# Patient Record
Sex: Female | Born: 1960 | Hispanic: Refuse to answer | Marital: Married | Smoking: Never smoker
Health system: Southern US, Community
[De-identification: ages and names within clinical notes are randomized; demographics above are authoritative.]

## PROBLEM LIST (undated history)

## (undated) DIAGNOSIS — Z9889 Other specified postprocedural states: Secondary | ICD-10-CM

## (undated) DIAGNOSIS — I2699 Other pulmonary embolism without acute cor pulmonale: Secondary | ICD-10-CM

## (undated) DIAGNOSIS — I82409 Acute embolism and thrombosis of unspecified deep veins of unspecified lower extremity: Secondary | ICD-10-CM

## (undated) DIAGNOSIS — R87629 Unspecified abnormal cytological findings in specimens from vagina: Secondary | ICD-10-CM

## (undated) DIAGNOSIS — E559 Vitamin D deficiency, unspecified: Secondary | ICD-10-CM

## (undated) DIAGNOSIS — I10 Essential (primary) hypertension: Secondary | ICD-10-CM

## (undated) DIAGNOSIS — M1990 Unspecified osteoarthritis, unspecified site: Secondary | ICD-10-CM

## (undated) DIAGNOSIS — Z87898 Personal history of other specified conditions: Secondary | ICD-10-CM

## (undated) HISTORY — DX: Essential (primary) hypertension: I10

## (undated) HISTORY — DX: Other pulmonary embolism without acute cor pulmonale: I26.99

## (undated) HISTORY — PX: SHOULDER SURGERY: SHX246

## (undated) HISTORY — DX: Other specified postprocedural states: Z98.890

## (undated) HISTORY — DX: Personal history of other specified conditions: Z87.898

## (undated) HISTORY — DX: Vitamin D deficiency, unspecified: E55.9

## (undated) HISTORY — PX: KNEE SURGERY: SHX244

## (undated) HISTORY — DX: Unspecified abnormal cytological findings in specimens from vagina: R87.629

## (undated) HISTORY — DX: Acute embolism and thrombosis of unspecified deep veins of unspecified lower extremity: I82.409

## (undated) HISTORY — DX: Unspecified osteoarthritis, unspecified site: M19.90

---

## 2001-06-15 ENCOUNTER — Ambulatory Visit (HOSPITAL_COMMUNITY): Admission: RE | Admit: 2001-06-15 | Discharge: 2001-06-15 | Payer: Self-pay | Admitting: Radiology

## 2003-06-25 ENCOUNTER — Emergency Department (HOSPITAL_COMMUNITY): Admission: AD | Admit: 2003-06-25 | Discharge: 2003-06-25 | Payer: Self-pay | Admitting: Family Medicine

## 2008-09-28 ENCOUNTER — Emergency Department (HOSPITAL_COMMUNITY): Admission: EM | Admit: 2008-09-28 | Discharge: 2008-09-28 | Payer: Self-pay | Admitting: Family Medicine

## 2009-02-21 ENCOUNTER — Ambulatory Visit: Payer: Self-pay | Admitting: Family Medicine

## 2009-05-22 ENCOUNTER — Ambulatory Visit: Payer: Self-pay | Admitting: Family Medicine

## 2009-07-03 ENCOUNTER — Ambulatory Visit (HOSPITAL_BASED_OUTPATIENT_CLINIC_OR_DEPARTMENT_OTHER): Admission: RE | Admit: 2009-07-03 | Discharge: 2009-07-03 | Payer: Self-pay | Admitting: Orthopedic Surgery

## 2009-08-22 ENCOUNTER — Ambulatory Visit: Payer: Self-pay | Admitting: Family Medicine

## 2009-11-06 ENCOUNTER — Ambulatory Visit: Payer: Self-pay | Admitting: Family Medicine

## 2009-11-06 ENCOUNTER — Other Ambulatory Visit: Admission: RE | Admit: 2009-11-06 | Discharge: 2009-11-06 | Payer: Self-pay | Admitting: Family Medicine

## 2009-11-19 ENCOUNTER — Ambulatory Visit: Payer: Self-pay | Admitting: Family Medicine

## 2010-06-03 LAB — POCT HEMOGLOBIN-HEMACUE: Hemoglobin: 12 g/dL (ref 12.0–15.0)

## 2013-06-23 ENCOUNTER — Encounter (HOSPITAL_COMMUNITY): Payer: Self-pay | Admitting: Emergency Medicine

## 2013-06-23 ENCOUNTER — Emergency Department (HOSPITAL_COMMUNITY): Payer: PRIVATE HEALTH INSURANCE

## 2013-06-23 ENCOUNTER — Inpatient Hospital Stay (HOSPITAL_COMMUNITY)
Admission: EM | Admit: 2013-06-23 | Discharge: 2013-06-24 | DRG: 176 | Disposition: A | Discharge status: Home / Self Care | Payer: PRIVATE HEALTH INSURANCE | Attending: Internal Medicine | Admitting: Internal Medicine

## 2013-06-23 ENCOUNTER — Emergency Department (HOSPITAL_COMMUNITY)
Admission: EM | Admit: 2013-06-23 | Discharge: 2013-06-23 | Disposition: A | Discharge status: Nursing Home (Includes ICF) | Payer: PRIVATE HEALTH INSURANCE | Source: Home / Self Care | Attending: Family Medicine | Admitting: Family Medicine

## 2013-06-23 DIAGNOSIS — I2699 Other pulmonary embolism without acute cor pulmonale: Secondary | ICD-10-CM

## 2013-06-23 DIAGNOSIS — E669 Obesity, unspecified: Secondary | ICD-10-CM

## 2013-06-23 DIAGNOSIS — R7309 Other abnormal glucose: Secondary | ICD-10-CM | POA: Diagnosis present

## 2013-06-23 DIAGNOSIS — R079 Chest pain, unspecified: Secondary | ICD-10-CM

## 2013-06-23 DIAGNOSIS — R0781 Pleurodynia: Secondary | ICD-10-CM

## 2013-06-23 DIAGNOSIS — D259 Leiomyoma of uterus, unspecified: Secondary | ICD-10-CM | POA: Diagnosis present

## 2013-06-23 DIAGNOSIS — D509 Iron deficiency anemia, unspecified: Secondary | ICD-10-CM | POA: Diagnosis present

## 2013-06-23 DIAGNOSIS — D649 Anemia, unspecified: Secondary | ICD-10-CM

## 2013-06-23 DIAGNOSIS — K219 Gastro-esophageal reflux disease without esophagitis: Secondary | ICD-10-CM

## 2013-06-23 DIAGNOSIS — Z6841 Body Mass Index (BMI) 40.0 and over, adult: Secondary | ICD-10-CM

## 2013-06-23 DIAGNOSIS — R739 Hyperglycemia, unspecified: Secondary | ICD-10-CM

## 2013-06-23 DIAGNOSIS — I82409 Acute embolism and thrombosis of unspecified deep veins of unspecified lower extremity: Secondary | ICD-10-CM | POA: Diagnosis present

## 2013-06-23 DIAGNOSIS — I5030 Unspecified diastolic (congestive) heart failure: Secondary | ICD-10-CM | POA: Diagnosis present

## 2013-06-23 DIAGNOSIS — Z86711 Personal history of pulmonary embolism: Secondary | ICD-10-CM | POA: Diagnosis present

## 2013-06-23 LAB — CBC WITH DIFFERENTIAL/PLATELET
BASOS ABS: 0 10*3/uL (ref 0.0–0.1)
Basophils Relative: 0 % (ref 0–1)
Eosinophils Absolute: 0.1 10*3/uL (ref 0.0–0.7)
Eosinophils Relative: 1 % (ref 0–5)
HCT: 27.2 % — ABNORMAL LOW (ref 36.0–46.0)
HEMOGLOBIN: 8.5 g/dL — AB (ref 12.0–15.0)
LYMPHS PCT: 21 % (ref 12–46)
Lymphs Abs: 2 10*3/uL (ref 0.7–4.0)
MCH: 23.3 pg — ABNORMAL LOW (ref 26.0–34.0)
MCHC: 31.3 g/dL (ref 30.0–36.0)
MCV: 74.5 fL — ABNORMAL LOW (ref 78.0–100.0)
MONO ABS: 1.2 10*3/uL — AB (ref 0.1–1.0)
MONOS PCT: 13 % — AB (ref 3–12)
NEUTROS ABS: 6.1 10*3/uL (ref 1.7–7.7)
Neutrophils Relative %: 65 % (ref 43–77)
Platelets: 294 10*3/uL (ref 150–400)
RBC: 3.65 MIL/uL — ABNORMAL LOW (ref 3.87–5.11)
RDW: 18.1 % — ABNORMAL HIGH (ref 11.5–15.5)
WBC: 9.3 10*3/uL (ref 4.0–10.5)

## 2013-06-23 LAB — IRON AND TIBC: UIBC: 405 ug/dL — ABNORMAL HIGH (ref 125–400)

## 2013-06-23 LAB — FOLATE: FOLATE: 13.8 ng/mL

## 2013-06-23 LAB — BASIC METABOLIC PANEL
BUN: 7 mg/dL (ref 6–23)
CO2: 22 meq/L (ref 19–32)
CREATININE: 0.73 mg/dL (ref 0.50–1.10)
Calcium: 9.2 mg/dL (ref 8.4–10.5)
Chloride: 103 mEq/L (ref 96–112)
GFR calc Af Amer: >90 mL/min (ref >90)
GFR calc non Af Amer: >90 mL/min (ref >90)
Glucose, Bld: 119 mg/dL — ABNORMAL HIGH (ref 70–99)
Potassium: 4.2 mEq/L (ref 3.7–5.3)
Sodium: 138 mEq/L (ref 137–147)

## 2013-06-23 LAB — I-STAT TROPONIN, ED: TROPONIN I, POC: 0 ng/mL (ref 0.00–0.08)

## 2013-06-23 LAB — TSH: TSH: 4.24 u[IU]/mL (ref 0.350–4.500)

## 2013-06-23 LAB — ANTITHROMBIN III: ANTITHROMB III FUNC: 84 % (ref 75–120)

## 2013-06-23 LAB — HEMOGLOBIN A1C
Hgb A1c MFr Bld: 5.9 % — ABNORMAL HIGH (ref <5.7)
Mean Plasma Glucose: 123 mg/dL — ABNORMAL HIGH (ref <117)

## 2013-06-23 LAB — RETICULOCYTES
RBC.: 3.58 MIL/uL — ABNORMAL LOW (ref 3.87–5.11)
Retic Count, Absolute: 75.2 10*3/uL (ref 19.0–186.0)
Retic Ct Pct: 2.1 % (ref 0.4–3.1)

## 2013-06-23 LAB — VITAMIN B12: VITAMIN B 12: 472 pg/mL (ref 211–911)

## 2013-06-23 LAB — FERRITIN: Ferritin: 15 ng/mL (ref 10–291)

## 2013-06-23 MED ORDER — FERROUS FUMARATE 325 (106 FE) MG PO TABS
1.0000 | ORAL_TABLET | Freq: Two times a day (BID) | ORAL | Status: DC
  Administered 2013-06-23 – 2013-06-24 (×2): 106 mg via ORAL
  Filled 2013-06-23 (×3): qty 1

## 2013-06-23 MED ORDER — NITROGLYCERIN 0.4 MG SL SUBL
SUBLINGUAL_TABLET | SUBLINGUAL | Status: AC
  Filled 2013-06-23: qty 1

## 2013-06-23 MED ORDER — NITROGLYCERIN 0.4 MG SL SUBL
0.4000 mg | SUBLINGUAL_TABLET | SUBLINGUAL | Status: DC | PRN
  Administered 2013-06-23: 0.4 mg via SUBLINGUAL

## 2013-06-23 MED ORDER — ASPIRIN 325 MG PO TABS
325.0000 mg | ORAL_TABLET | Freq: Once | ORAL | Status: AC
  Administered 2013-06-23: 325 mg via ORAL
  Filled 2013-06-23: qty 1

## 2013-06-23 MED ORDER — ONDANSETRON HCL 4 MG/2ML IJ SOLN: 4.0000 mg | Freq: Four times a day (QID) | INTRAMUSCULAR | Status: DC | PRN

## 2013-06-23 MED ORDER — BENZONATATE 100 MG PO CAPS
200.0000 mg | ORAL_CAPSULE | Freq: Three times a day (TID) | ORAL | Status: DC | PRN
  Administered 2013-06-23: 200 mg via ORAL
  Filled 2013-06-23: qty 2

## 2013-06-23 MED ORDER — MORPHINE SULFATE 2 MG/ML IJ SOLN: 1.0000 mg | INTRAMUSCULAR | Status: DC | PRN

## 2013-06-23 MED ORDER — ONDANSETRON HCL 4 MG PO TABS: 4.0000 mg | ORAL_TABLET | Freq: Four times a day (QID) | ORAL | Status: DC | PRN

## 2013-06-23 MED ORDER — SODIUM CHLORIDE 0.9 % IV SOLN
INTRAVENOUS | Status: AC
  Administered 2013-06-23 – 2013-06-24 (×2): via INTRAVENOUS

## 2013-06-23 MED ORDER — SODIUM CHLORIDE 0.9 % IV SOLN
Freq: Once | INTRAVENOUS | Status: AC
  Administered 2013-06-23: 09:00:00 via INTRAVENOUS

## 2013-06-23 MED ORDER — SODIUM CHLORIDE 0.9 % IJ SOLN
3.0000 mL | Freq: Two times a day (BID) | INTRAMUSCULAR | Status: DC
  Administered 2013-06-23 – 2013-06-24 (×3): 3 mL via INTRAVENOUS

## 2013-06-23 MED ORDER — IOHEXOL 350 MG/ML SOLN
80.0000 mL | Freq: Once | INTRAVENOUS | Status: AC | PRN
  Administered 2013-06-23: 80 mL via INTRAVENOUS

## 2013-06-23 MED ORDER — POLYETHYLENE GLYCOL 3350 17 G PO PACK
17.0000 g | PACK | Freq: Every day | ORAL | Status: DC
  Administered 2013-06-24: 17 g via ORAL
  Filled 2013-06-23 (×2): qty 1

## 2013-06-23 MED ORDER — OXYCODONE HCL 5 MG PO TABS: 5.0000 mg | ORAL_TABLET | ORAL | Status: DC | PRN

## 2013-06-23 MED ORDER — ENOXAPARIN SODIUM 120 MG/0.8ML ~~LOC~~ SOLN
110.0000 mg | Freq: Two times a day (BID) | SUBCUTANEOUS | Status: DC
  Administered 2013-06-23 – 2013-06-24 (×3): 110 mg via SUBCUTANEOUS
  Filled 2013-06-23 (×4): qty 0.8

## 2013-06-23 MED ORDER — PANTOPRAZOLE SODIUM 40 MG PO TBEC
40.0000 mg | DELAYED_RELEASE_TABLET | Freq: Every day | ORAL | Status: DC
  Administered 2013-06-24: 40 mg via ORAL
  Filled 2013-06-23: qty 1

## 2013-06-23 MED ORDER — ACETAMINOPHEN 325 MG PO TABS: 650.0000 mg | ORAL_TABLET | Freq: Four times a day (QID) | ORAL | Status: DC | PRN

## 2013-06-23 MED ORDER — ACETAMINOPHEN 650 MG RE SUPP: 650.0000 mg | Freq: Four times a day (QID) | RECTAL | Status: DC | PRN

## 2013-06-23 NOTE — Progress Notes (Signed)
Patient has arrived to unit. Patient alert and oriented, no complaints at this time, denies pain. Patient placed on telemetry, [place] notified. Patients vital signs stable with BP 133/61, HR 86 in NSR, and 99% on RA. Will monitor patient.

## 2013-06-23 NOTE — ED Notes (Signed)
Patient transported to CT 

## 2013-06-23 NOTE — ED Provider Notes (Signed)
CSN: [id number]     Arrival date & time 06/23/13  0920 History   First MD Initiated Contact with Patient 06/23/13 [phone]     Chief Complaint  Patient presents with  . Chest Pain     (Consider location/radiation/quality/duration/timing/severity/associated sxs/prior Treatment) Patient is a 53 y.o. female presenting with chest pain. The history is provided by the patient. No language interpreter was used.  Chest Pain Pain location:  L chest Pain quality: sharp   Pain radiates to:  Neck and L shoulder Pain radiates to the back: yes   Pain severity:  Moderate Onset quality:  Unable to specify Duration:  2 days Timing:  Intermittent Progression:  Waxing and waning Chronicity:  New Context: at rest   Relieved by:  Nothing Worsened by:  Nothing tried Ineffective treatments:  None tried Associated symptoms: fever, lower extremity edema and shortness of breath   Associated symptoms: no abdominal pain, no back pain, no cough, no diaphoresis, no fatigue, no headache, no nausea, no numbness, no palpitations, not vomiting and no weakness   Shortness of breath:    Severity:  Moderate   Duration:  2 days   Timing:  Intermittent   Progression:  Unchanged Risk factors: obesity   Risk factors: no high cholesterol, no hypertension, no prior DVT/PE, no smoking and no surgery     History reviewed. No pertinent past medical history. Past Surgical History  Procedure Laterality Date  . Knee surgery      Right   History reviewed. No pertinent family history. History  Substance Use Topics  . Smoking status: Never Smoker   . Smokeless tobacco: Not on file  . Alcohol Use: No   OB History   Grav Para Term Preterm Abortions TAB SAB Ect Mult Living                 Review of Systems  Constitutional: Positive for fever. Negative for chills, diaphoresis, activity change, appetite change and fatigue.  HENT: Negative for congestion, facial swelling, rhinorrhea and sore throat.   Eyes: Negative for  photophobia and discharge.  Respiratory: Positive for shortness of breath. Negative for cough and chest tightness.   Cardiovascular: Positive for chest pain. Negative for palpitations and leg swelling.  Gastrointestinal: Negative for nausea, vomiting, abdominal pain and diarrhea.  Endocrine: Negative for polydipsia and polyuria.  Genitourinary: Negative for dysuria, frequency, difficulty urinating and pelvic pain.  Musculoskeletal: Negative for arthralgias, back pain, neck pain and neck stiffness.  Skin: Negative for color change and wound.  Allergic/Immunologic: Negative for immunocompromised state.  Neurological: Negative for facial asymmetry, weakness, numbness and headaches.  Hematological: Does not bruise/bleed easily.  Psychiatric/Behavioral: Negative for confusion and agitation.      Allergies  Review of patient's allergies indicates no known allergies.  Home Medications   Current Outpatient Rx  Name  Route  Sig  Dispense  Refill  . benzonatate (TESSALON) 200 MG capsule   Oral   Take 1 capsule (200 mg total) by mouth 3 (three) times daily as needed for cough.   30 capsule   0   . ferrous fumarate (HEMOCYTE - 106 MG FE) 325 (106 FE) MG TABS tablet   Oral   Take 1 tablet (106 mg of iron total) by mouth 2 (two) times daily.   30 each   2   . oxyCODONE (OXY IR/ROXICODONE) 5 MG immediate release tablet   Oral   Take 1 tablet (5 mg total) by mouth every 6 (six) hours as needed  for moderate pain.   45 tablet   0   . pantoprazole (PROTONIX) 40 MG tablet   Oral   Take 1 tablet (40 mg total) by mouth daily.   30 tablet   1   . polyethylene glycol (MIRALAX / GLYCOLAX) packet   Oral   Take 17 g by mouth daily.   30 each   0   . Rivaroxaban (XARELTO) 15 MG TABS tablet   Oral   Take 1 tablet (15 mg total) by mouth 2 (two) times daily with a meal.   42 tablet   0   . Rivaroxaban (XARELTO) 20 MG TABS tablet   Oral   Take 1 tablet (20 mg total) by mouth daily with  supper.   30 tablet   1    BP 115/41  Pulse 90  Temp(Src) 99.5 F (37.5 C) (Oral)  Resp 20  Ht 5\' 4"  (1.626 m)  Wt 270 lb 14.4 oz (122.879 kg)  BMI 46.48 kg/m2  SpO2 100%  LMP 06/16/2013 Physical Exam  Constitutional: She is oriented to person, place, and time. She appears well-developed and well-nourished. No distress.  HENT:  Head: Normocephalic and atraumatic.  Mouth/Throat: No oropharyngeal exudate.  Eyes: Pupils are equal, round, and reactive to light.  Neck: Normal range of motion. Neck supple.  Cardiovascular: Normal rate, regular rhythm and normal heart sounds.  Exam reveals no gallop and no friction rub.   No murmur heard. Pulmonary/Chest: Effort normal and breath sounds normal. No respiratory distress. She has no wheezes. She has no rales.  Abdominal: Soft. Bowel sounds are normal. She exhibits no distension and no mass. There is no tenderness. There is no rebound and no guarding.  Musculoskeletal: Normal range of motion. She exhibits edema (2+ LLE). She exhibits no tenderness.  Neurological: She is alert and oriented to person, place, and time.  Skin: Skin is warm and dry.  Psychiatric: She has a normal mood and affect.    ED Course  Procedures (including critical care time) Labs Review Labs Reviewed  CBC WITH DIFFERENTIAL - Abnormal; Notable for the following:    RBC 3.65 (*)    Hemoglobin 8.5 (*)    HCT 27.2 (*)    MCV 74.5 (*)    MCH 23.3 (*)    RDW 18.1 (*)    Monocytes Relative 13 (*)    Monocytes Absolute 1.2 (*)    All other components within normal limits  BASIC METABOLIC PANEL - Abnormal; Notable for the following:    Glucose, Bld 119 (*)    All other components within normal limits  IRON AND TIBC - Abnormal; Notable for the following:    Iron <10 (*)    UIBC 405 (*)    All other components within normal limits  RETICULOCYTES - Abnormal; Notable for the following:    RBC. 3.58 (*)    All other components within normal limits  HEMOGLOBIN A1C  - Abnormal; Notable for the following:    Hemoglobin A1C 5.9 (*)    Mean Plasma Glucose 123 (*)    All other components within normal limits  CBC - Abnormal; Notable for the following:    RBC 3.59 (*)    Hemoglobin 8.2 (*)    HCT 26.5 (*)    MCV 73.8 (*)    MCH 22.8 (*)    RDW 18.2 (*)    All other components within normal limits  BASIC METABOLIC PANEL - Abnormal; Notable for the following:  BUN 4 (*)    All other components within normal limits  LIPID PANEL - Abnormal; Notable for the following:    HDL 36 (*)    All other components within normal limits  VITAMIN B12  FOLATE  FERRITIN  ANTITHROMBIN III  HOMOCYSTEINE  TSH  PROTEIN C ACTIVITY  PROTEIN C, TOTAL  PROTEIN S ACTIVITY  PROTEIN S, TOTAL  LUPUS ANTICOAGULANT PANEL  BETA-2-GLYCOPROTEIN I ABS, IGG/M/A  FACTOR 5 LEIDEN  PROTHROMBIN GENE MUTATION  CARDIOLIPIN ANTIBODIES, IGG, IGM, IGA  I-STAT TROPOININ, ED   Imaging Review Ct Angio Chest Pe W/cm &/or Wo Cm  06/23/2013   CLINICAL DATA:  Chest pain and shortness of breath. The pain is worse on the left. The pain increases with activity and deep breaths. The symptoms are worse when flat.  EXAM: CT ANGIOGRAPHY CHEST WITH CONTRAST  TECHNIQUE: Multidetector CT imaging of the chest was performed using the standard protocol during bolus administration of intravenous contrast. Multiplanar CT image reconstructions and MIPs were obtained to evaluate the vascular anatomy.  CONTRAST:  29mL OMNIPAQUE IOHEXOL 350 MG/ML SOLN  COMPARISON:  Two-view chest x-ray from the same day.  FINDINGS: Pulmonary arterial opacification is satisfactory. There is a focal filling defect extending in the superior segment of the right lower lobe. Additional right upper lobe pulmonary embolus is present. Embolus is noted within the left lower lobar artery and extending into the segmental arteries. No significant left upper lobe emboli are evident.  Bilateral lower lobe airspace disease is evident, left greater  than right. This could be related to the emboli. A moderate left pleural effusion is present.  Limited imaging of the upper abdomen is unremarkable.  No significant mediastinal or axillary adenopathy is evident.  The bone windows demonstrate fusion across multiple anterior osteophytes compatible with DISH. No focal lytic or blastic lesions are present.  Review of the MIP images confirms the above findings.  IMPRESSION: 1. Bilateral lower lobe pulmonary emboli, left greater than right. At least 1 right upper lobe embolus is present as well. 2. Bilateral lower lobe airspace disease, left greater than right. This could in part be related to the emboli. 3. Moderate left-sided pleural effusion. Critical Value/emergent results were called by telephone at the time of interpretation on 06/23/2013 at 11:44 AM to Dr. Jinny Blossom, [facility] , who verbally acknowledged these results.   Electronically Signed   By: Lawrence Santiago M.D.   On: 06/23/2013 11:45     EKG Interpretation   Date/Time:  Friday June 23 2013 09:27:41 EDT Ventricular Rate:  98 PR Interval:  124 QRS Duration: 85 QT Interval:  332 QTC Calculation: 424 R Axis:   51 Text Interpretation:  Sinus rhythm Low voltage, precordial leads  Borderline repolarization abnormality Confirmed by DOCHERTY  MD, MEGAN  ([id number]) on 06/23/2013 9:34:22 AM      MDM   Final diagnoses:  Pulmonary embolism    Pt is a 53 y.o. female with Pmhx as above who presents with 2 days of intermittent, sharp, L sided pain which was initially in neck & shoulder, then radiating to chest beginning today. She has had assoc SOB w/ exertion as well as LLE swelling for about 1 month. No fever, cough, ab pain, n/v, d/a. On PE, T 100.4, HR 107 at UC,. 2+ LLE edema. Cardiopulm exam benign. EKG w/ non-specific ST-T changes. Given concern for PE, CTA ordered, was positive for BLLE PE, and small R upper lobe PE.  Pt given tx dose Lovenox, [facility] consulted for  admission. Had brief discussion about  coumadin vs Xarelto, but pt currently undecided.         Neta Ehlers, MD 06/25/13 1032

## 2013-06-23 NOTE — Progress Notes (Signed)
*  PRELIMINARY RESULTS* Vascular Ultrasound Lower extremity venous duplex has been completed.  Preliminary findings: Right = no evidence of DVT. Left = DVT involving the distal femoral vein, popliteal vein, gastroc veins, posterior tibial veins, and peroneal veins. Superficial thrombus of the lesser saphenous vein.   Chapman Fitch RVT 06/23/2013, 4:54 PM

## 2013-06-23 NOTE — ED Notes (Signed)
Pt arrives [facility] from [facility] Care for 2 days of chest pain. Pt was at home and began having cramping in her neck. Pain has progressed to her chest. Shortness of Breath with exertion.

## 2013-06-23 NOTE — ED Notes (Signed)
Patient ekg faxed to cath lab.  Spoke to bryan in cath lab.  Reported dr Martinique saw this ekg and said not a stemi-but does need evaluation/cath.  Notified dr Juventino Slovak.

## 2013-06-23 NOTE — H&P (Signed)
[facility] History and Physical  Joyce Alexander TIW:[id number] DOB: 09-Jan-1961 DOA: 06/23/2013  Referring physician: Dr. Tawnya Crook PCP: No PCP Per Patient   Chief Complaint: Chest pain, shortness of breath  HPI: Joyce Alexander is a 53 y.o. female with past medical history significant for obesity, osteoarthritis and fibroids; came to the hospital secondary to pleuritic chest pain and shortness of breath. Patient reports having symptoms for the last 3-4 days and associated with worsening with deep inspiration and activity. She endorses ongoing swelling and pain of her legs (left more than right); both report that she was seen at urgent care initially for the problem and in her legs and was told she had plantar fasciitis and was started on Anaprox. Patient denies any chills, nausea, limiting, abdominal pain, melena, hematochezia, hematemesis, headaches, changes in her vision or any other acute complaints. In the ED patient was found to be with low grade temperature, tachycardia, tachypneic and with pleuritic chest pain; she had a CT I view of her chest that demonstrated bilateral pulmonary embolism. [facility] hospitalist has been called to admit the patient for further evaluation and treatment.  Review of Systems:  Negative except as otherwise mentioned on history of present illness.  History reviewed. No pertinent past medical history. Past Surgical History  Procedure Laterality Date  . Knee surgery      Right   Social History:  reports that she has never smoked. She does not have any smokeless tobacco history on file. She reports that she does not drink alcohol or use illicit drugs.  No Known Allergies  Family history: Significant for hypertension and diabetes, otherwise noncontributory.  Prior to Admission medications   Medication Sig Start Date End Date Taking? Authorizing Provider  naproxen sodium (ANAPROX) 220 MG tablet Take 220 mg by mouth 2 (two) times daily as needed.   Yes  Historical Provider, MD  OVER THE COUNTER MEDICATION Take 2 tablets by mouth every 4 (four) hours as needed (for pain. [employer] Brand pain reliever).   Yes Historical Provider, MD   Physical Exam: Filed Vitals:   06/23/13 1328  BP: 133/61  Pulse: 89  Temp: 98.7 F (37.1 C)  Resp: 18    BP 133/61  Pulse 89  Temp(Src) 98.7 F (37.1 C) (Oral)  Resp 18  Ht 5\' 4"  (1.626 m)  Wt 122.7 kg (270 lb 8.1 oz)  BMI 46.41 kg/m2  SpO2 99%  LMP 06/16/2013  General:  Appears calm and comfortable Eyes: PERRL, normal lids, irises & conjunctiva ENT: grossly normal hearing, lips & tongue Neck: no LAD, masses or thyromegaly; no JVD Cardiovascular: RRR, no m/r/g. No LE edema. Telemetry and EKG: Sinus rhythm, mild tachycardia Respiratory: Scattered rhonchi, no wheezing or crackles. Abdomen: soft, nt, nd; positive bowel sounds Skin: no rash or induration seen on exam Musculoskeletal: grossly normal tone BUE/BLE; left lower extremity swelling and positive tenderness to palpation. Right lower extremity also swollen but less in comparison to left lower extremity. Full range of motion of her joints except for her right knee (had history of arthroscopy and ongoing OA) Psychiatric: grossly normal mood and affect, speech fluent and appropriate Neurologic: grossly non-focal.          Labs on Admission:  Basic Metabolic Panel:  Recent Labs Lab 06/23/13 0953  NA 138  K 4.2  CL 103  CO2 22  GLUCOSE 119*  BUN 7  CREATININE 0.73  CALCIUM 9.2   CBC:  Recent Labs Lab 06/23/13 0953  WBC 9.3  NEUTROABS 6.1  HGB 8.5*  HCT 27.2*  MCV 74.5*  PLT 294   Radiological Exams on Admission: Dg Chest 2 View  06/23/2013   CLINICAL DATA:  Chest pain and short of breath.  EXAM: CHEST  2 VIEW  COMPARISON:  None.  FINDINGS: Artifact overlies chest. Heart size is normal. There is infiltrate in volume loss in both lower lobes, left worse than right, most consistent with bronchopneumonia. Upper lungs are clear. The  may be a small amount of pleural fluid. Heart size appears normal. Mediastinal shadows are normal. No significant bony finding.  IMPRESSION: Volume loss and infiltrate in both lower lobes left worse than right most consistent with pneumonia.   Electronically Signed   By: Nelson Chimes M.D.   On: 06/23/2013 10:19   Ct Angio Chest Pe W/cm &/or Wo Cm  06/23/2013   CLINICAL DATA:  Chest pain and shortness of breath. The pain is worse on the left. The pain increases with activity and deep breaths. The symptoms are worse when flat.  EXAM: CT ANGIOGRAPHY CHEST WITH CONTRAST  TECHNIQUE: Multidetector CT imaging of the chest was performed using the standard protocol during bolus administration of intravenous contrast. Multiplanar CT image reconstructions and MIPs were obtained to evaluate the vascular anatomy.  CONTRAST:  21mL OMNIPAQUE IOHEXOL 350 MG/ML SOLN  COMPARISON:  Two-view chest x-ray from the same day.  FINDINGS: Pulmonary arterial opacification is satisfactory. There is a focal filling defect extending in the superior segment of the right lower lobe. Additional right upper lobe pulmonary embolus is present. Embolus is noted within the left lower lobar artery and extending into the segmental arteries. No significant left upper lobe emboli are evident.  Bilateral lower lobe airspace disease is evident, left greater than right. This could be related to the emboli. A moderate left pleural effusion is present.  Limited imaging of the upper abdomen is unremarkable.  No significant mediastinal or axillary adenopathy is evident.  The bone windows demonstrate fusion across multiple anterior osteophytes compatible with DISH. No focal lytic or blastic lesions are present.  Review of the MIP images confirms the above findings.  IMPRESSION: 1. Bilateral lower lobe pulmonary emboli, left greater than right. At least 1 right upper lobe embolus is present as well. 2. Bilateral lower lobe airspace disease, left greater than  right. This could in part be related to the emboli. 3. Moderate left-sided pleural effusion. Critical Value/emergent results were called by telephone at the time of interpretation on 06/23/2013 at 11:44 AM to Dr. Jinny Blossom, [facility] , who verbally acknowledged these results.   Electronically Signed   By: Lawrence Santiago M.D.   On: 06/23/2013 11:45    EKG:  Ventricular Rate: 98  PR Interval: 124  QRS Duration: 85  QT Interval: 332  QTC Calculation: 424  R Axis: 51  Text Interpretation: Sinus rhythm, Low voltage, precordial leads  With Borderline repolarization abnormality   Assessment/Plan 1-shortness of breath and pleuritic chest pain: Secondary to bilateral pulmonary embolism. -Patient having medically stable at this moment and without saddle emboli seen on CTA. -will admit to telemetry -start full dose lovenox -discussion for xarelto vs coumadin sustained and CM involved to help with price/insurance coverage -2-D echo has been ordered -hypercoagulable panel has also been ordered -LE dopplers done and demonstrated LLE DVT (most likely explaining PE) -Negative troponin  2-Obesity: Low calorie diet and exercise has been discussed with patient.   3-Anemia: Patient is still with ongoing menstrual periods (she described them to  be havier than usual and was told of presence of fibroids) -Will check anemia panel -Giving history of ongoing heavy menses, will start ferrous fumarate Follow hemoglobin trend  4-Hyperglycemia: Has never been checked for diabetes. -Will repeat fasting blood sugar and check A1c  5-GERD (gastroesophageal reflux disease): Start PPI   6-nonproductive cough: Most likely as a sitter with ongoing pulmonary embolism. -Will use PRN antitussives   7-left lower extremity DVT: Patient has been started on anticoagulation therapy.   Code Status: full Family Communication: husband at bedside Disposition Plan: observation, telemetry bed, LOS less than 2 minutes Time spent:  50 minutes  [facility] Pager [phone]

## 2013-06-23 NOTE — ED Notes (Signed)
Report given to [facility]. 

## 2013-06-23 NOTE — Progress Notes (Addendum)
ANTICOAGULATION CONSULT NOTE - Initial Consult  Pharmacy Consult for Lovenox Indication: pulmonary embolus  No Known Allergies  Patient Measurements: Height: 5\' 4"  (162.6 cm) Weight: 240 lb (108.863 kg) IBW/kg (Calculated) : 54.7  Vital Signs: Temp: 98.9 F (37.2 C) (04/10 0932) Temp src: Oral (04/10 0932) BP: 114/51 mmHg (04/10 0945) Pulse Rate: 91 (04/10 0945)  Labs:  Recent Labs  06/23/13 0953  HGB 8.5*  HCT 27.2*  PLT 294  CREATININE 0.73    Estimated Creatinine Clearance: 100.3 ml/min (by C-G formula based on Cr of 0.73).   Medical History: History reviewed. No pertinent past medical history.   Assessment: 68 YOF seen with 2 day history of intermittent, sharp, L sided pain which was initially in neck and shoulder and has now radiated to chest. troponins negative. Found to have PE on CT angio.  SCr 0.73 with est CrCL ~127mL/min.  Not on anticoagulation PTA. Baseline hgb low at 8.5, plts 294. No overt bleeding noted.  Goal of Therapy:  Anti-Xa level 0.6-1 units/ml 4hrs after LMWH dose given Monitor platelets by anticoagulation protocol: Yes   Plan:   1. Lovenox 110mg  (1mg /kg) subQ q12h 2. CBC q72h at minimum 3. Follow renal function 4. Follow for any sign of bleeding and long term anticoagulation plans  Holdyn Poyser D. Greyson Peavy, PharmD, BCPS Clinical Pharmacist Pager: [phone] 06/23/2013 12:08 PM

## 2013-06-23 NOTE — Progress Notes (Signed)
Report received on patient from [facility], Bessie in ED. Will be expecting patients arrival to unit, room 3E30

## 2013-06-23 NOTE — ED Notes (Signed)
Pt c/o chest pain onset 3 days Pain is on left side; increases w/activity and deep breaths Sxs today also include SOB  Denies HA, n/v, diaphoresis Alert w/no signs of acute distress.

## 2013-06-23 NOTE — ED Provider Notes (Signed)
CSN: [id number]     Arrival date & time 06/23/13  0815 History   First MD Initiated Contact with Patient 06/23/13 [phone]     Chief Complaint  Patient presents with  . Chest Pain   (Consider location/radiation/quality/duration/timing/severity/associated sxs/prior Treatment) Patient is a 53 y.o. female presenting with chest pain. The history is provided by the patient.  Chest Pain Pain location:  Substernal area Pain quality: tightness   Pain radiates to:  L jaw Pain radiates to the back: no   Pain severity:  Moderate Onset quality:  Sudden Duration:  2 days Timing:  Constant Progression:  Worsening Chronicity:  New Relieved by:  Nothing Worsened by:  Exertion and certain positions Associated symptoms: lower extremity edema, shortness of breath and weakness   Associated symptoms: no diaphoresis, no nausea, no palpitations and not vomiting   Associated symptoms comment:  Edema off and on left lower leg recently. Risk factors: obesity   Risk factors: no birth control, no hypertension, no immobilization, no prior DVT/PE, no smoking and no surgery     History reviewed. No pertinent past medical history. History reviewed. No pertinent past surgical history. No family history on file. History  Substance Use Topics  . Smoking status: Never Smoker   . Smokeless tobacco: Not on file  . Alcohol Use: No   OB History   Grav Para Term Preterm Abortions TAB SAB Ect Mult Living                 Review of Systems  Constitutional: Negative for diaphoresis.  Respiratory: Positive for chest tightness and shortness of breath.   Cardiovascular: Positive for chest pain and leg swelling. Negative for palpitations.  Gastrointestinal: Negative.  Negative for nausea and vomiting.  Neurological: Positive for weakness.    Allergies  Review of patient's allergies indicates no known allergies.  Home Medications  No current outpatient prescriptions on file. BP 156/81  Pulse 107  Temp(Src) 100.4  F (38 C) (Oral)  Resp 20  SpO2 96%  LMP 06/16/2013 Physical Exam  Nursing note and vitals reviewed. Constitutional: She is oriented to person, place, and time. She appears well-developed and well-nourished. She appears distressed.  Eyes: Pupils are equal, round, and reactive to light.  Neck: Normal range of motion. Neck supple.  Cardiovascular: Regular rhythm, normal heart sounds, intact distal pulses and normal pulses.  Tachycardia present.   Pulmonary/Chest: Breath sounds normal. She is in respiratory distress. She exhibits no tenderness.  Musculoskeletal: She exhibits edema. She exhibits no tenderness.  Lymphadenopathy:    She has no cervical adenopathy.  Neurological: She is alert and oriented to person, place, and time.  Skin: Skin is warm and dry.    ED Course  Procedures (including critical care time) Labs Review Labs Reviewed - No data to display Imaging Review No results found. ecg--st-t wave changes inf and lat.  MDM   1. Chest pain radiating to jaw    Sent for eval of acute worsening chest tightness , sob , abnl ecg., r/o acute  PE., left calf swelling.     Billy Fischer, MD 06/23/13 [phone]

## 2013-06-23 NOTE — ED Notes (Signed)
Pt states she no longer has chest pain, she just feels SOB. O2 sats 99% on room air.

## 2013-06-23 NOTE — ED Notes (Signed)
Patient transported to X-ray 

## 2013-06-24 DIAGNOSIS — R7309 Other abnormal glucose: Secondary | ICD-10-CM

## 2013-06-24 DIAGNOSIS — D649 Anemia, unspecified: Secondary | ICD-10-CM

## 2013-06-24 DIAGNOSIS — K219 Gastro-esophageal reflux disease without esophagitis: Secondary | ICD-10-CM

## 2013-06-24 DIAGNOSIS — I379 Nonrheumatic pulmonary valve disorder, unspecified: Secondary | ICD-10-CM

## 2013-06-24 DIAGNOSIS — I2699 Other pulmonary embolism without acute cor pulmonale: Secondary | ICD-10-CM | POA: Diagnosis present

## 2013-06-24 DIAGNOSIS — R071 Chest pain on breathing: Secondary | ICD-10-CM

## 2013-06-24 DIAGNOSIS — E669 Obesity, unspecified: Secondary | ICD-10-CM

## 2013-06-24 LAB — CBC
HCT: 26.5 % — ABNORMAL LOW (ref 36.0–46.0)
Hemoglobin: 8.2 g/dL — ABNORMAL LOW (ref 12.0–15.0)
MCH: 22.8 pg — ABNORMAL LOW (ref 26.0–34.0)
MCHC: 30.9 g/dL (ref 30.0–36.0)
MCV: 73.8 fL — AB (ref 78.0–100.0)
Platelets: 301 10*3/uL (ref 150–400)
RBC: 3.59 MIL/uL — AB (ref 3.87–5.11)
RDW: 18.2 % — ABNORMAL HIGH (ref 11.5–15.5)
WBC: 7.1 10*3/uL (ref 4.0–10.5)

## 2013-06-24 LAB — BASIC METABOLIC PANEL
BUN: 4 mg/dL — ABNORMAL LOW (ref 6–23)
CHLORIDE: 104 meq/L (ref 96–112)
CO2: 22 meq/L (ref 19–32)
Calcium: 8.8 mg/dL (ref 8.4–10.5)
Creatinine, Ser: 0.7 mg/dL (ref 0.50–1.10)
GFR calc Af Amer: >90 mL/min (ref >90)
GFR calc non Af Amer: >90 mL/min (ref >90)
Glucose, Bld: 91 mg/dL (ref 70–99)
Potassium: 3.9 mEq/L (ref 3.7–5.3)
Sodium: 139 mEq/L (ref 137–147)

## 2013-06-24 LAB — LIPID PANEL
CHOL/HDL RATIO: 3.7 ratio
CHOLESTEROL: 133 mg/dL (ref 0–200)
HDL: 36 mg/dL — ABNORMAL LOW (ref >39)
LDL Cholesterol: 83 mg/dL (ref 0–99)
Triglycerides: 68 mg/dL (ref <150)
VLDL: 14 mg/dL (ref 0–40)

## 2013-06-24 LAB — HOMOCYSTEINE: HOMOCYSTEINE-NORM: 8.2 umol/L (ref 4.0–15.4)

## 2013-06-24 MED ORDER — OXYCODONE HCL 5 MG PO TABS: 5.0000 mg | ORAL_TABLET | Freq: Four times a day (QID) | ORAL | Status: DC | PRN

## 2013-06-24 MED ORDER — RIVAROXABAN 15 MG PO TABS: 15.0000 mg | ORAL_TABLET | Freq: Two times a day (BID) | ORAL | Status: DC

## 2013-06-24 MED ORDER — PANTOPRAZOLE SODIUM 40 MG PO TBEC: 40.0000 mg | DELAYED_RELEASE_TABLET | Freq: Every day | ORAL | Status: DC

## 2013-06-24 MED ORDER — FERROUS FUMARATE 325 (106 FE) MG PO TABS: 1.0000 | ORAL_TABLET | Freq: Two times a day (BID) | ORAL | Status: DC

## 2013-06-24 MED ORDER — BENZONATATE 200 MG PO CAPS: 200.0000 mg | ORAL_CAPSULE | Freq: Three times a day (TID) | ORAL | Status: DC | PRN

## 2013-06-24 MED ORDER — RIVAROXABAN 20 MG PO TABS: 20.0000 mg | ORAL_TABLET | Freq: Every day | ORAL | Status: DC

## 2013-06-24 MED ORDER — POLYETHYLENE GLYCOL 3350 17 G PO PACK: 17.0000 g | PACK | Freq: Every day | ORAL | Status: DC

## 2013-06-24 NOTE — Progress Notes (Signed)
   CARE MANAGEMENT NOTE 06/24/2013  Patient:  Joyce Alexander, Joyce Alexander   Account Number:  [id number]  Date Initiated:  06/24/2013  Documentation initiated by:  [facility]  Subjective/Objective Assessment:   adm: Chest pain, shortness of breath     Action/Plan:   discharge planning   Anticipated DC Date:  06/24/2013   Anticipated DC Plan:  [place]  CM consult  Medication Assistance  PCP issues      Choice offered to / List presented to:             Status of service:  Completed, signed off Medicare Important Message given?   (If response is "NO", the following Medicare IM given date fields will be blank) Date Medicare IM given:   Date Additional Medicare IM given:    Discharge Disposition:  HOME/SELF CARE  Per UR Regulation:    If discussed at Long Length of Stay Meetings, dates discussed:    Comments:  06/24/13 10:00 CM met with pt in room and gave pt a 30 day free trial Xarelto card.  CM activated card for pt.  Pt verbalized understanding the 30 day free trial card would give her enough time to go to her follow up visst and if she continues to require xarelto, her physician's office can get pre-authorization and she can then utilize the discount co-pay card.  She states she will have the physician office staff activate the monthly co-pay card if needed.  CM gave pt resource PCP list and encourages her to use the 1-800 number on the back of her insurance card to request a listing of PCP choices for her network.  No other CM needs were communicated.  Mariane Masters, BSN ,CM [phone].

## 2013-06-24 NOTE — Progress Notes (Signed)
  Echocardiogram 2D Echocardiogram has been performed.  Carney Corners 06/24/2013, 11:35 AM

## 2013-06-24 NOTE — Progress Notes (Signed)
Utilization Review Completed.  

## 2013-06-24 NOTE — Discharge Summary (Signed)
Physician Discharge Summary  Joyce Alexander NIO:[id number] DOB: 01/21/1961 DOA: 06/23/2013  PCP: No PCP Per Patient  Admit date: 06/23/2013 Discharge date: 06/24/2013  Time spent: >30 minutes  Recommendations for Outpatient Follow-up:  1. CBC to follow Hgb trend 2. BMET to follow electrolytes and renal function 3. Reassess BP and if needed start antihypertensive regimen 4. LFT's to be check 4 weeks after been started on xarelto 5. Needs follow up with OB/GYN for tx of her fibroids (in case heavy menses continue) 6. Follow results of hypercoagulable panel  Discharge Diagnoses:  Pulmonary embolism LLE DVT Obesity Iron deficiency Anemia Pleuritic chest pain Pre diabetes with Hyperglycemia (A1C 5.9) GERD (gastroesophageal reflux disease) Grade 1 diastolic dysfunction   Discharge Condition: stable and improved. Will establish care with PCP and will take medications as provided.  Diet recommendation: low sodium and low carbohydrates diet  Filed Weights   06/23/13 0932 06/23/13 1328 06/24/13 0649  Weight: 108.863 kg (240 lb) 122.7 kg (270 lb 8.1 oz) 122.879 kg (270 lb 14.4 oz)    History of present illness:  53 y.o. female with past medical history significant for obesity, osteoarthritis and fibroids; came to the hospital secondary to pleuritic chest pain and shortness of breath. Patient reports having symptoms for the last 3-4 days and associated with worsening with deep inspiration and activity. She endorses ongoing swelling and pain of her legs (left more than right); both report that she was seen at urgent care initially for the problem and in her legs and was told she had plantar fasciitis and was started on Anaprox. Patient denies any chills, nausea, limiting, abdominal pain, melena, hematochezia, hematemesis, headaches, changes in her vision or any other acute complaints.  In the ED patient was found to be with low grade temperature, tachycardia, tachypneic and with pleuritic  chest pain; she had a CT I view of her chest that demonstrated bilateral pulmonary embolism.   Hospital Course:  1-shortness of breath and pleuritic chest pain: Secondary to bilateral pulmonary embolism.  -Patient feeling better and currently w/o CP or SOB -will discharge on xarelto -2-D echo has r/o any impairment on right side of her heart -hypercoagulable panel pending at discharge -LE dopplers done and demonstrated LLE DVT (most likely explaining PE)  -Negative troponin   2-Obesity: Low calorie diet and exercise has been discussed with patient.   3-Anemia: Patient is still with ongoing menstrual periods (she described them to be havier than usual and was told of presence of fibroids)  -anemia panel demonstrated iron deficiency anemia -most likely due to history of ongoing heavy menses -will discharge on ferrous fumarate  -CBC as an outpatient -need follow up with OB/GYN for fibroids therapy  4-Hyperglycemia: due to pre-diabetes. -A1C 5.9 -no medication needed at this point. But will encourage patient to lose weight and follow low carb diet  5-GERD (gastroesophageal reflux disease): Started on PPI   6-nonproductive cough: Most likely in setting of acute pulmonary embolism and GERD.  -Will use PRN antitussives  -started on PPI -continue tx for DVT/PE  7-left lower extremity DVT: Patient has been started on anticoagulation therapy.  -will need follow up with PCP as an outpatient to follow resolution of acute event (swelling, pain) after been on anticoagulation for couple months.  8-Chronic Grade one diastolic heart failure: no wall motion abnormalities and preserved EF on 2-D echo -patient advised to follow low sodium diet -no further medications/intervention needed at this point.   Procedures:  LE  Venous duplex: positive for  LLE DVT  2-D echo The cavity size was normal. Wall thickness was normal. Systolic function was normal. The estimated ejection fraction was in  the range of 60% to 65%. Wall motion was normal; there were no regional wall motion abnormalities. Doppler parameters are consistent with abnormal left ventricular relaxation (grade 1 diastolic dysfunction).    Consultations:  None   Discharge Exam: Filed Vitals:   06/24/13 1300  BP: 115/41  Pulse: 90  Temp: 99.5 F (37.5 C)  Resp: 20    General: afebrile, denies CP or SOB; good O2 sat on RA Cardiovascular: S1 and S2, no rubs or gallops Respiratory: CTA bilaterally Abd: soft, NT, ND, positive BS Extremities: 1++Edema LLE and some tenderness on the back of her leg; no cyanosis. Trace edema appreciated on her RLE as well.   Discharge Instructions You were cared for by a hospitalist during your hospital stay. If you have any questions about your discharge medications or the care you received while you were in the hospital after you are discharged, you can call the unit and asked to speak with the hospitalist on call if the hospitalist that took care of you is not available. Once you are discharged, your primary care physician will handle any further medical issues. Please note that NO REFILLS for any discharge medications will be authorized once you are discharged, as it is imperative that you return to your primary care physician (or establish a relationship with a primary care physician if you do not have one) for your aftercare needs so that they can reassess your need for medications and monitor your lab values.  Discharge Orders   Future Orders Complete By Expires   Diet - low sodium heart healthy  As directed    Discharge instructions  As directed        Medication List    STOP taking these medications       naproxen sodium 220 MG tablet  Commonly known as:  ANAPROX     OVER THE COUNTER MEDICATION      TAKE these medications       benzonatate 200 MG capsule  Commonly known as:  TESSALON  Take 1 capsule (200 mg total) by mouth 3 (three) times daily as needed for  cough.     ferrous fumarate 325 (106 FE) MG Tabs tablet  Commonly known as:  HEMOCYTE - 106 mg FE  Take 1 tablet (106 mg of iron total) by mouth 2 (two) times daily.     oxyCODONE 5 MG immediate release tablet  Commonly known as:  Oxy IR/ROXICODONE  Take 1 tablet (5 mg total) by mouth every 6 (six) hours as needed for moderate pain.     pantoprazole 40 MG tablet  Commonly known as:  PROTONIX  Take 1 tablet (40 mg total) by mouth daily.     polyethylene glycol packet  Commonly known as:  MIRALAX / GLYCOLAX  Take 17 g by mouth daily.     Rivaroxaban 15 MG Tabs tablet  Commonly known as:  XARELTO  Take 1 tablet (15 mg total) by mouth 2 (two) times daily with a meal.     Rivaroxaban 20 MG Tabs tablet  Commonly known as:  XARELTO  Take 1 tablet (20 mg total) by mouth daily with supper.  Start taking on:  07/16/2013       No Known Allergies    The results of significant diagnostics from this hospitalization (including imaging, microbiology, ancillary and laboratory) are listed  below for reference.    Significant Diagnostic Studies: Dg Chest 2 View  06/23/2013   CLINICAL DATA:  Chest pain and short of breath.  EXAM: CHEST  2 VIEW  COMPARISON:  None.  FINDINGS: Artifact overlies chest. Heart size is normal. There is infiltrate in volume loss in both lower lobes, left worse than right, most consistent with bronchopneumonia. Upper lungs are clear. The may be a small amount of pleural fluid. Heart size appears normal. Mediastinal shadows are normal. No significant bony finding.  IMPRESSION: Volume loss and infiltrate in both lower lobes left worse than right most consistent with pneumonia.   Electronically Signed   By: Nelson Chimes M.D.   On: 06/23/2013 10:19   Ct Angio Chest Pe W/cm &/or Wo Cm  06/23/2013   CLINICAL DATA:  Chest pain and shortness of breath. The pain is worse on the left. The pain increases with activity and deep breaths. The symptoms are worse when flat.  EXAM: CT  ANGIOGRAPHY CHEST WITH CONTRAST  TECHNIQUE: Multidetector CT imaging of the chest was performed using the standard protocol during bolus administration of intravenous contrast. Multiplanar CT image reconstructions and MIPs were obtained to evaluate the vascular anatomy.  CONTRAST:  63mL OMNIPAQUE IOHEXOL 350 MG/ML SOLN  COMPARISON:  Two-view chest x-ray from the same day.  FINDINGS: Pulmonary arterial opacification is satisfactory. There is a focal filling defect extending in the superior segment of the right lower lobe. Additional right upper lobe pulmonary embolus is present. Embolus is noted within the left lower lobar artery and extending into the segmental arteries. No significant left upper lobe emboli are evident.  Bilateral lower lobe airspace disease is evident, left greater than right. This could be related to the emboli. A moderate left pleural effusion is present.  Limited imaging of the upper abdomen is unremarkable.  No significant mediastinal or axillary adenopathy is evident.  The bone windows demonstrate fusion across multiple anterior osteophytes compatible with DISH. No focal lytic or blastic lesions are present.  Review of the MIP images confirms the above findings.  IMPRESSION: 1. Bilateral lower lobe pulmonary emboli, left greater than right. At least 1 right upper lobe embolus is present as well. 2. Bilateral lower lobe airspace disease, left greater than right. This could in part be related to the emboli. 3. Moderate left-sided pleural effusion. Critical Value/emergent results were called by telephone at the time of interpretation on 06/23/2013 at 11:44 AM to Dr. Jinny Blossom, [facility] , who verbally acknowledged these results.   Electronically Signed   By: Lawrence Santiago M.D.   On: 06/23/2013 11:45    Labs: Basic Metabolic Panel:  Recent Labs Lab 06/23/13 0953 06/24/13 0529  NA 138 139  K 4.2 3.9  CL 103 104  CO2 22 22  GLUCOSE 119* 91  BUN 7 4*  CREATININE 0.73 0.70  CALCIUM 9.2  8.8   CBC:  Recent Labs Lab 06/23/13 0953 06/24/13 0529  WBC 9.3 7.1  NEUTROABS 6.1  --   HGB 8.5* 8.2*  HCT 27.2* 26.5*  MCV 74.5* 73.8*  PLT 294 301    Signed:  Barton Dubois  [facility] 06/24/2013, 3:05 PM

## 2013-06-26 LAB — PROTEIN S ACTIVITY: Protein S Activity: 66 % — ABNORMAL LOW (ref 69–129)

## 2013-06-26 LAB — LUPUS ANTICOAGULANT PANEL
DRVVT: 36.7 s (ref <42.9)
LUPUS ANTICOAGULANT: NOT DETECTED
PTT LA: 41.6 s (ref 28.0–43.0)

## 2013-06-26 LAB — CARDIOLIPIN ANTIBODIES, IGG, IGM, IGA
ANTICARDIOLIPIN IGA: 6 U/mL — AB (ref <22)
Anticardiolipin IgG: 7 GPL U/mL — ABNORMAL LOW (ref <23)
Anticardiolipin IgM: 5 MPL U/mL — ABNORMAL LOW (ref <11)

## 2013-06-26 LAB — BETA-2-GLYCOPROTEIN I ABS, IGG/M/A
BETA 2 GLYCO I IGG: 7 G Units (ref <20)
Beta-2-Glycoprotein I IgA: 10 A Units (ref <20)
Beta-2-Glycoprotein I IgM: 11 M Units (ref <20)

## 2013-06-26 LAB — PROTEIN C, TOTAL: PROTEIN C, TOTAL: 48 % — AB (ref 72–160)

## 2013-06-26 LAB — PROTEIN C ACTIVITY: Protein C Activity: 45 % — ABNORMAL LOW (ref 75–133)

## 2013-06-26 LAB — PROTHROMBIN GENE MUTATION

## 2013-06-26 LAB — PROTEIN S, TOTAL: PROTEIN S AG TOTAL: 87 % (ref 60–150)

## 2013-06-26 LAB — FACTOR 5 LEIDEN

## 2013-08-08 LAB — RESULTS CONSOLE HPV: CHL HPV: NEGATIVE

## 2013-11-01 ENCOUNTER — Other Ambulatory Visit: Payer: Self-pay | Admitting: Oncology

## 2019-07-22 ENCOUNTER — Other Ambulatory Visit: Payer: Self-pay

## 2019-07-22 ENCOUNTER — Emergency Department (HOSPITAL_COMMUNITY)
Admission: EM | Admit: 2019-07-22 | Discharge: 2019-07-22 | Disposition: A | Discharge status: Home / Self Care | Payer: 59 | Attending: Emergency Medicine | Admitting: Emergency Medicine

## 2019-07-22 ENCOUNTER — Emergency Department (HOSPITAL_COMMUNITY): Payer: 59

## 2019-07-22 ENCOUNTER — Encounter (HOSPITAL_COMMUNITY): Payer: Self-pay

## 2019-07-22 DIAGNOSIS — M79602 Pain in left arm: Secondary | ICD-10-CM | POA: Insufficient documentation

## 2019-07-22 MED ORDER — MELOXICAM 7.5 MG PO TABS: 7.5000 mg | ORAL_TABLET | Freq: Every day | ORAL | 0 refills | Status: DC

## 2019-07-22 MED ORDER — DIAZEPAM 2 MG PO TABS
2.0000 mg | ORAL_TABLET | Freq: Once | ORAL | Status: AC
  Administered 2019-07-22: 2 mg via ORAL
  Filled 2019-07-22: qty 1

## 2019-07-22 MED ORDER — KETOROLAC TROMETHAMINE 60 MG/2ML IM SOLN
30.0000 mg | Freq: Once | INTRAMUSCULAR | Status: AC
  Administered 2019-07-22: 30 mg via INTRAMUSCULAR
  Filled 2019-07-22: qty 2

## 2019-07-22 NOTE — Discharge Instructions (Signed)
Recommend using anti-inflammatory as prescribed for your pain.  I recommend follow-up both with orthopedics and neurology regarding the pain and weakness that you are experiencing today.  If you feel that your weakness worsens, you develop numbness, skin color changes, fever or other new concerning symptom, please return to the emergency room for reassessment.

## 2019-07-22 NOTE — ED Provider Notes (Signed)
[facility] EMERGENCY DEPARTMENT Provider Note   CSN: [id number] Arrival date & time: 07/22/19  1355     History Chief Complaint  Patient presents with   Arm Pain    GAY BYWATER is a 59 y.o. female.  1 month ago patient had the second during a Covid vaccine in her left arm.  States that since that time she has been having soreness in her left arm.  Over the past week however the pain seems to be worsened, states since yesterday again worsened and she also feels that since yesterday her arm has been weaker, has a hard time lifting any heavy objects, including her ch[place] is more difficult.  No numbness.  Has noted that certain positions and movements make the pain worse and has had some tenderness to affected area ongoing. No other weakness in other extremities, no speech changes, difficult walking.   PMH DVT/PE but no states she no longer is on blood thinner.   HPI     History reviewed. No pertinent past medical history.  Patient Active Problem List   Diagnosis Date Noted   Pulmonary emboli ([place]) 06/24/2013   Pulmonary embolism ([place]) 06/23/2013   Obesity 06/23/2013   Anemia 06/23/2013   Pleuritic chest pain 06/23/2013   Hyperglycemia 06/23/2013   GERD (gastroesophageal reflux disease) 06/23/2013   PE (pulmonary embolism) 06/23/2013    Past Surgical History:  Procedure Laterality Date   KNEE SURGERY     Right     OB History   No obstetric history on file.     History reviewed. No pertinent family history.  Social History   Tobacco Use   Smoking status: Never Smoker  Substance Use Topics   Alcohol use: No   Drug use: No    Home Medications Prior to Admission medications   Medication Sig Start Date End Date Taking? Authorizing Provider  benzonatate (TESSALON) 200 MG capsule Take 1 capsule (200 mg total) by mouth 3 (three) times daily as needed for cough. 06/24/13   Barton Dubois, MD  ferrous fumarate (HEMOCYTE - 106 MG  FE) 325 (106 FE) MG TABS tablet Take 1 tablet (106 mg of iron total) by mouth 2 (two) times daily. 06/24/13   Barton Dubois, MD  meloxicam (MOBIC) 7.5 MG tablet Take 1 tablet (7.5 mg total) by mouth daily. 07/22/19   Lucrezia Starch, MD  oxyCODONE (OXY IR/ROXICODONE) 5 MG immediate release tablet Take 1 tablet (5 mg total) by mouth every 6 (six) hours as needed for moderate pain. 06/24/13   Barton Dubois, MD  pantoprazole (PROTONIX) 40 MG tablet Take 1 tablet (40 mg total) by mouth daily. 06/24/13   Barton Dubois, MD  polyethylene glycol [facility] Floria Raveling) packet Take 17 g by mouth daily. 06/24/13   Barton Dubois, MD  Rivaroxaban (XARELTO) 15 MG TABS tablet Take 1 tablet (15 mg total) by mouth 2 (two) times daily with a meal. 06/24/13 07/15/13  Barton Dubois, MD  Rivaroxaban (XARELTO) 20 MG TABS tablet Take 1 tablet (20 mg total) by mouth daily with supper. 07/16/13   Barton Dubois, MD    Allergies    Patient has no known allergies.  Review of Systems   Review of Systems  Physical Exam Updated Vital Signs BP (!) 124/40 (BP Location: Right Arm)    Pulse (!) 58    Temp 98 F (36.7 C) (Oral)    Resp 15    Ht 5\' 4"  (1.626 m)    Wt 117.9  kg    LMP 05/22/2019    SpO2 98%    BMI 44.63 kg/m   Physical Exam Vitals and nursing note reviewed.  Constitutional:      General: She is not in acute distress.    Appearance: She is well-developed.  HENT:     Head: Normocephalic and atraumatic.  Eyes:     Conjunctiva/sclera: Conjunctivae normal.  Cardiovascular:     Rate and Rhythm: Normal rate.     Pulses: Normal pulses.  Pulmonary:     Effort: Pulmonary effort is normal. No respiratory distress.  Abdominal:     Palpations: Abdomen is soft.     Tenderness: There is no abdominal tenderness.  Musculoskeletal:     Cervical back: Neck supple.     Comments: LUE:   No significant swelling, no echymosis, atrophy, or deformity on visual inspection, on palpation there is some tenderness of mid upper  arm but no palpable deformity, sensation to light touch is intact throughout extremity, strong radial pulse, patient reports having a hard time lifting her arm up but when isolating muscle groups she has full 5/5 strength (arm flexion, extension, abduction and adduction)  Skin:    General: Skin is warm and dry.  Neurological:     Mental Status: She is alert.     Comments: See msk     ED Results / Procedures / Treatments   Labs (all labs ordered are listed, but only abnormal results are displayed) Labs Reviewed - No data to display  EKG None  Radiology DG Humerus Left  Result Date: 07/22/2019 CLINICAL DATA:  Worsening soreness of the left arm after receiving COVID vaccine, difficulty moving arm. EXAM: LEFT HUMERUS - 2+ VIEW COMPARISON:  None. FINDINGS: No gas identified in the soft tissues. Mild spurring of the [facility] joint. No elbow joint effusion. There is mild spurring of the coronoid process in the elbow. IMPRESSION: 1. Mild spurring of the coronoid process and AC joint. 2. No gas in the soft tissues. 3. Please note that typically, [place]-Turner syndrome will not be visible on radiography. Electronically Signed   By: Van Clines M.D.   On: 07/22/2019 16:06   UE VENOUS DUPLEX ([facility] & [facility] 7 am - 7 pm)  Result Date: 07/23/2019 UPPER VENOUS STUDY  Indications: Pain, hx of DVT, and Swelling Performing Technologist: Brandon, RVT  Examination Guidelines: A complete evaluation includes B-mode imaging, spectral Doppler, color Doppler, and power Doppler as needed of all accessible portions of each vessel. Bilateral testing is considered an integral part of a complete examination. Limited examinations for reoccurring indications may be performed as noted.  Right Findings: +----------+------------+---------+-----------+----------+-------+  RIGHT      Compressible Phasicity Spontaneous Properties Summary  +----------+------------+---------+-----------+----------+-------+  Subclavian      Full        Yes        Yes                         +----------+------------+---------+-----------+----------+-------+  Left Findings: +----------+------------+---------+-----------+----------+-------+  LEFT       Compressible Phasicity Spontaneous Properties Summary  +----------+------------+---------+-----------+----------+-------+  IJV            Full        Yes        Yes                         +----------+------------+---------+-----------+----------+-------+  Subclavian     Full  Yes        Yes                         +----------+------------+---------+-----------+----------+-------+  Axillary       Full        Yes        Yes                         +----------+------------+---------+-----------+----------+-------+  Brachial       Full        Yes        Yes                         +----------+------------+---------+-----------+----------+-------+  Radial         Full                                               +----------+------------+---------+-----------+----------+-------+  Ulnar          Full                                               +----------+------------+---------+-----------+----------+-------+  Cephalic       Full        Yes        Yes                         +----------+------------+---------+-----------+----------+-------+  Basilic        Full                                               +----------+------------+---------+-----------+----------+-------+  Summary:  Right: No evidence of thrombosis in the subclavian.  Left: No evidence of deep vein thrombosis in the upper extremity. No evidence of superficial vein thrombosis in the upper extremity.  *See table(s) above for measurements and observations.  Diagnosing physician: Deitra Mayo MD Electronically signed by Deitra Mayo MD on 07/23/2019 at 7:21:01 AM.    Final     Procedures Procedures (including critical care time)  Medications Ordered in ED Medications  diazepam (VALIUM) tablet 2 mg (2 mg Oral Given 07/22/19  1523)  ketorolac (TORADOL) injection 30 mg (30 mg Intramuscular Given 07/22/19 1524)    ED Course  I have reviewed the triage vital signs and the nursing notes.  Pertinent labs & imaging results that were available during my care of the patient were reviewed by me and considered in my medical decision making (see chart for details).    MDM Rules/Calculators/A&P                      59 year old lady who presented to ER with pain, weakness of her left arm associated with covid vaccine one month ago.  Given her history of VTE, no longer on AC, obtained DVT ultrasound which was negative.  X-ray negative for fracture.  Given this is associated with vaccine, considered possibility of something more rare such as a brachial neuritis or [place] Turner syndrome. Though patient had  reported weakness, I was unable to localize focal weakness on exam. When isolating muscle groups of her proximal arm, she demonstrated very robust strength. Given the positional nature, worse with palpation, no associated sensory loss, suspect acute neuropathy is less likely and that most likely this is related to MSK strain. Nevertheless, given the reported history, I recommended she follow up with both ortho and neurology. Additionally recommended trial of scheduled NSAIDs.  Reviewed return precautions and d/c home.  After the discussed management above, the patient was determined to be safe for discharge.  The patient was in agreement with this plan and all questions regarding their care were answered.  ED return precautions were discussed and the patient will return to the ED with any significant worsening of condition.  Final Clinical Impression(s) / ED Diagnoses Final diagnoses:  Left arm pain    Rx / DC Orders ED Discharge Orders         Ordered    meloxicam (MOBIC) 7.5 MG tablet  Daily     07/22/19 1706           Lucrezia Starch, MD 07/23/19 1644

## 2019-07-22 NOTE — Progress Notes (Signed)
Left upper extremity venous duplex complete.  Please see CV proc tab for preliminary results. Lita Mains- RDMS, RVT 4:23 PM  07/22/2019

## 2019-07-22 NOTE — ED Notes (Signed)
Pt transported to Xray. 

## 2019-07-22 NOTE — ED Triage Notes (Signed)
Received COVID vaccine 4-5 and arm has been sore since.  In the past 1 week pain has worsened.  Onset last night has been unable to move left arm upwards d/t pain. Pain worse when arm moved.

## 2019-08-10 ENCOUNTER — Encounter: Payer: Self-pay | Admitting: Family Medicine

## 2019-08-10 ENCOUNTER — Ambulatory Visit: Payer: 59 | Admitting: Family Medicine

## 2019-08-10 ENCOUNTER — Other Ambulatory Visit: Payer: Self-pay

## 2019-08-10 VITALS — BP 122/70 | HR 61 | Ht 63.5 in | Wt 257.0 lb

## 2019-08-10 DIAGNOSIS — Z833 Family history of diabetes mellitus: Secondary | ICD-10-CM

## 2019-08-10 DIAGNOSIS — Z01818 Encounter for other preprocedural examination: Secondary | ICD-10-CM | POA: Diagnosis not present

## 2019-08-10 DIAGNOSIS — R7303 Prediabetes: Secondary | ICD-10-CM

## 2019-08-10 DIAGNOSIS — Z13220 Encounter for screening for lipoid disorders: Secondary | ICD-10-CM

## 2019-08-10 DIAGNOSIS — Z86711 Personal history of pulmonary embolism: Secondary | ICD-10-CM

## 2019-08-10 DIAGNOSIS — Z86718 Personal history of other venous thrombosis and embolism: Secondary | ICD-10-CM

## 2019-08-10 NOTE — Patient Instructions (Signed)
I am referring you to a blood specialist (hematologist) for further evaluation of the blood clots in your past history.  They will call you to schedule.   Please see the cardiologist as scheduled.   Call and schedule with Dr. Dema Severin since you are overdue.   Return her for a complete physical in the next few weeks.

## 2019-08-11 ENCOUNTER — Encounter: Payer: Self-pay | Admitting: Family Medicine

## 2019-08-11 DIAGNOSIS — R7303 Prediabetes: Secondary | ICD-10-CM

## 2019-08-11 HISTORY — DX: Prediabetes: R73.03

## 2019-08-11 LAB — CBC WITH DIFFERENTIAL/PLATELET
Basophils Absolute: 0 10*3/uL (ref 0.0–0.2)
Basos: 0 %
EOS (ABSOLUTE): 0.1 10*3/uL (ref 0.0–0.4)
Eos: 2 %
Hematocrit: 41.3 % (ref 34.0–46.6)
Hemoglobin: 13.6 g/dL (ref 11.1–15.9)
Immature Grans (Abs): 0 10*3/uL (ref 0.0–0.1)
Immature Granulocytes: 0 %
Lymphocytes Absolute: 2.1 10*3/uL (ref 0.7–3.1)
Lymphs: 47 %
MCH: 30.5 pg (ref 26.6–33.0)
MCHC: 32.9 g/dL (ref 31.5–35.7)
MCV: 93 fL (ref 79–97)
Monocytes Absolute: 0.5 10*3/uL (ref 0.1–0.9)
Monocytes: 12 %
Neutrophils Absolute: 1.7 10*3/uL (ref 1.4–7.0)
Neutrophils: 39 %
Platelets: 201 10*3/uL (ref 150–450)
RBC: 4.46 x10E6/uL (ref 3.77–5.28)
RDW: 13.6 % (ref 11.7–15.4)
WBC: 4.5 10*3/uL (ref 3.4–10.8)

## 2019-08-11 LAB — COMPREHENSIVE METABOLIC PANEL
ALT: 13 IU/L (ref 0–32)
AST: 19 IU/L (ref 0–40)
Albumin/Globulin Ratio: 1.5 (ref 1.2–2.2)
Albumin: 4 g/dL (ref 3.8–4.9)
Alkaline Phosphatase: 47 IU/L — ABNORMAL LOW (ref 48–121)
BUN/Creatinine Ratio: 21 (ref 9–23)
BUN: 15 mg/dL (ref 6–24)
Bilirubin Total: 0.6 mg/dL (ref 0.0–1.2)
CO2: 21 mmol/L (ref 20–29)
Calcium: 9.2 mg/dL (ref 8.7–10.2)
Chloride: 105 mmol/L (ref 96–106)
Creatinine, Ser: 0.73 mg/dL (ref 0.57–1.00)
GFR calc Af Amer: 106 mL/min/{1.73_m2} (ref >59)
GFR calc non Af Amer: 92 mL/min/{1.73_m2} (ref >59)
Globulin, Total: 2.7 g/dL (ref 1.5–4.5)
Glucose: 88 mg/dL (ref 65–99)
Potassium: 4.3 mmol/L (ref 3.5–5.2)
Sodium: 137 mmol/L (ref 134–144)
Total Protein: 6.7 g/dL (ref 6.0–8.5)

## 2019-08-11 LAB — LIPID PANEL
Chol/HDL Ratio: 3.3 ratio (ref 0.0–4.4)
Cholesterol, Total: 167 mg/dL (ref 100–199)
HDL: 51 mg/dL (ref >39)
LDL Chol Calc (NIH): 107 mg/dL — ABNORMAL HIGH (ref 0–99)
Triglycerides: 43 mg/dL (ref 0–149)
VLDL Cholesterol Cal: 9 mg/dL (ref 5–40)

## 2019-08-11 LAB — HEMOGLOBIN A1C
Est. average glucose Bld gHb Est-mCnc: 117 mg/dL
Hgb A1c MFr Bld: 5.7 % — ABNORMAL HIGH (ref 4.8–5.6)

## 2019-08-11 LAB — T4, FREE: Free T4: 1.02 ng/dL (ref 0.82–1.77)

## 2019-08-11 LAB — TSH: TSH: 2.86 u[IU]/mL (ref 0.450–4.500)

## 2019-08-11 NOTE — Progress Notes (Signed)
Subjective:    Patient ID: Joyce Alexander, female    DOB: 03/13/1961, 59 y.o.   MRN: [id number]  HPI Chief Complaint  Patient presents with  . new pt    new pt, surgery clearance. has cardio appt june 14th    She is new to the practice and here to establish care and for surgical clearance.  Previous medical care: no regular medical care in years   Other providers: Orthopedist- Dr. French Ana at [facility] and [facility] Cardiologist- Dr. Tanja Port  OB/GYN- Dr. Dema Severin in [facility]   History of DVT and PE in 2015. States she stopped Xarelto due to financial issues and did not follow up. States she was not told why she had the blood clots.   States she is having a surgery on her left rotator cuff. Surgery is not yet scheduled.  States she needs surgery clearance.   Denies fever, chills, dizziness, chest pain, palpitations, shortness of breath, abdominal pain, N/V/D, urinary symptoms, LE edema.    States she had knee surgery years ago. She denies any issues with anesthesia.   States she has left shoulder pain as well as chronic joint pain. Takes Tramadol prn, Tylenol, meloxicam prn.   Social history: married but does not live with her husband, no kids, she has a Magazine features editor, works as a Retail buyer for [employer].  Denies smoking or drug use. Rarely drinks alcohol.   Years since her last pap smear and 17 years since her last mammogram.  Never had a colonoscopy.  LMP: 08/07/2019    Reviewed allergies, medications, past medical, surgical, family, and social history.   Review of Systems Pertinent positives and negatives in the history of present illness.      Objective:   Physical Exam BP 122/70   Pulse 61   Ht 5' 3.5" (1.613 m)   Wt 257 lb (116.6 kg)   LMP 08/07/2019   SpO2 97%   BMI 44.81 kg/m   Alert and in no distress. Neck is supple without adenopathy or thyromegaly. Cardiac exam shows a regular rhythm without murmurs or gallops. Lungs are clear to  auscultation. Extremities without edema, calves soft, symmetric and non tender. Distal pulses intact. Negative Homans. Skin is warm and dry.       Assessment & Plan:  Pre-operative clearance - Plan: CBC with Differential/Platelet, Comprehensive metabolic panel, Hemoglobin A1c, Ambulatory referral to Hematology -She is a 59 year old female who is new to the practice today and here for surgical clearance.  She has not had any regular medical care in years.  She verbalizes that she does not like to go to doctors appointments.  Reports being in her usual state of health.  She is overdue in several areas of cancer screening and preventive health.  Discussed getting her back for a CPE and getting these updated. She denies any history of difficulty with anesthesia or surgery. Recommend that she see cardiology as scheduled and hematology before undergoing surgery.  Referral made to hematology.  Morbid obesity ([place]) - Plan: CBC with Differential/Platelet, Comprehensive metabolic panel, TSH, T4, free, Lipid panel -Counseling on long-term health consequences associated with morbid obesity.  Discussed briefly eating a healthy diet and getting adequate physical activity.  We will discuss this further at her CPE.  History of pulmonary embolism - Plan: CBC with Differential/Platelet, Comprehensive metabolic panel, Ambulatory referral to Hematology -As far as I can tell, bilateral PEs unprovoked and she stopped anticoagulation therapy on her own.  She did not  see hematology at the time as recommended.  She is willing now so I did make a referral today.  History of DVT (deep vein thrombosis) - Plan: CBC with Differential/Platelet, Comprehensive metabolic panel, Ambulatory referral to Hematology -See above note  Family history of diabetes mellitus in first degree relative - Plan: Hemoglobin A1c -Discussed that a family history of diabetes in both parents increases her risk of developing diabetes.  Counseling  done on healthy diet and exercise for weight loss and to improve insulin sensitivity.  Screening for lipid disorders - Plan: Lipid panel -Follow-up pending result.

## 2019-08-11 NOTE — Progress Notes (Signed)
Her labs are fine. She is still in the prediabetes range and does not have diabetes.

## 2019-08-22 ENCOUNTER — Telehealth: Payer: Self-pay | Admitting: Family Medicine

## 2019-08-22 NOTE — Telephone Encounter (Signed)
Received a new hem referral from Harland Dingwall, NP for DVT. Joyce Alexander/ Joyce Alexander has been cld and scheduled to see Dr. Lindi Adie on 6/11 at 3:45pm. Pt aware to arrive 15 minutes early.

## 2019-08-24 NOTE — Progress Notes (Signed)
[facility] CONSULT NOTE  Patient Care Team: Girtha Rm, NP-C as PCP - General (Family Medicine)  CHIEF COMPLAINTS/PURPOSE OF CONSULTATION:  History of PE and DVT, surgical clearance   HISTORY OF PRESENTING ILLNESS:  Joyce Alexander 59 y.o. female is here because of a history of PE and DVT, and need for clearance for surgery. On 06/23/2013, the patient presented to the ED with pleuritic chest pain and shortness of breath for 3-4 days prior, and bilateral swelling and leg pain. Chest CT showed bilateral pulmonary emboli and [place] of lower extremities showed a left lower extremity DVT. She was discharged on Xarelto but later stopped it due to financial issues. She presents to the clinic today for initial evaluation.    I reviewed her records extensively and collaborated the history with the patient.  MEDICAL HISTORY:  Past Medical History:  Diagnosis Date   DVT (deep venous thrombosis) (HCC)    History of febrile seizure    PE (pulmonary thromboembolism) ([place])    Prediabetes 08/11/2019    SURGICAL HISTORY: Past Surgical History:  Procedure Laterality Date   KNEE SURGERY     Right    SOCIAL HISTORY: Social History   Socioeconomic History   Marital status: Legally Separated    Spouse name: Not on file   Number of children: Not on file   Years of education: Not on file   Highest education level: Not on file  Occupational History   Not on file  Tobacco Use   Smoking status: Never Smoker   Smokeless tobacco: Never Used  Substance and Sexual Activity   Alcohol use: Yes    Comment: occ   Drug use: No   Sexual activity: Not on file  Other Topics Concern   Not on file  Social History Narrative   Not on file   Social Determinants of Health   Financial Resource Strain:    Difficulty of Paying Living Expenses:   Food Insecurity:    Worried About Charity fundraiser in the Last Year:    Arboriculturist in the Last Year:     Transportation Needs:    Film/video editor (Medical):    Lack of Transportation (Non-Medical):   Physical Activity:    Days of Exercise per Week:    Minutes of Exercise per Session:   Stress:    Feeling of Stress :   Social Connections:    Frequency of Communication with Friends and Family:    Frequency of Social Gatherings with Friends and Family:    Attends Religious Services:    Active Member of Clubs or Organizations:    Attends Music therapist:    Marital Status:   Intimate Partner Violence:    Fear of Current or Ex-Partner:    Emotionally Abused:    Physically Abused:    Sexually Abused:     FAMILY HISTORY: Family History  Problem Relation Age of Onset   Heart disease Mother    Diabetes Mother    Hypertension Mother    Diabetes Father    Hypertension Father     ALLERGIES:  has No Known Allergies.  MEDICATIONS:  Current Outpatient Medications  Medication Sig Dispense Refill   meloxicam (MOBIC) 7.5 MG tablet Take 1 tablet (7.5 mg total) by mouth daily. 15 tablet 0   traMADol (ULTRAM) 50 MG tablet Take 1 tablet (50 mg total) by mouth every 6 (six) hours as needed. 30 tablet    No  current facility-administered medications for this visit.    REVIEW OF SYSTEMS:   Constitutional: Denies fevers, chills or abnormal night sweats Eyes: Denies blurriness of vision, double vision or watery eyes Ears, nose, mouth, throat, and face: Denies mucositis or sore throat Respiratory: Denies cough, dyspnea or wheezes Cardiovascular: Denies palpitation, chest discomfort or lower extremity swelling Gastrointestinal:  Denies nausea, heartburn or change in bowel habits Skin: Denies abnormal skin rashes Lymphatics: Denies new lymphadenopathy or easy bruising Neurological:Denies numbness, tingling or new weaknesses Behavioral/Psych: Mood is stable, no new changes  Breast: Denies any palpable lumps or discharge All other systems were reviewed  with the patient and are negative.  PHYSICAL EXAMINATION: ECOG PERFORMANCE STATUS: 1 - Symptomatic but completely ambulatory  Vitals:   08/25/19 1539  BP: 131/63  Pulse: 66  Resp: 17  Temp: 98.5 F (36.9 C)  SpO2: 96%   Filed Weights   08/25/19 1539  Weight: 258 lb 14.4 oz (117.4 kg)    GENERAL:alert, no distress and comfortable SKIN: skin color, texture, turgor are normal, no rashes or significant lesions EYES: normal, conjunctiva are pink and non-injected, sclera clear OROPHARYNX:no exudate, no erythema and lips, buccal mucosa, and tongue normal  NECK: supple, thyroid normal size, non-tender, without nodularity LYMPH:  no palpable lymphadenopathy in the cervical, axillary or inguinal LUNGS: clear to auscultation and percussion with normal breathing effort HEART: regular rate & rhythm and no murmurs and no lower extremity edema ABDOMEN:abdomen soft, non-tender and normal bowel sounds Musculoskeletal:no cyanosis of digits and no clubbing  PSYCH: alert & oriented x 3 with fluent speech NEURO: no focal motor/sensory deficits  LABORATORY DATA:  I have reviewed the data as listed Lab Results  Component Value Date   WBC 4.5 08/10/2019   HGB 13.6 08/10/2019   HCT 41.3 08/10/2019   MCV 93 08/10/2019   PLT 201 08/10/2019   Lab Results  Component Value Date   NA 137 08/10/2019   K 4.3 08/10/2019   CL 105 08/10/2019   CO2 21 08/10/2019    RADIOGRAPHIC STUDIES: I have personally reviewed the radiological reports and agreed with the findings in the report.  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  Should tells me that she took Xarelto for 1 year  Current surgical plan: Left shoulder rotator cuff surgery Caprini score: 8 (based on prior history of blood clots, obesity, age and lower extremity edema) This translates to a 4% risk of DVT Recommendation: Postoperatively patient will need 10 days  of full anticoagulation.  This could be with DOAC or Lovenox.  If the shoulder cannot be ambulated early then she might need longer duration of anticoagulation. I sent a message to her physicians regarding her recommendations. Return to clinic on an as-needed basis.   All questions were answered. The patient knows to call the clinic with any problems, questions or concerns.   Rulon Eisenmenger, MD, MPH 08/25/2019    I, Molly Dorshimer, am acting as scribe for Nicholas Lose, MD.  I have reviewed the above documentation for accuracy and completeness, and I agree with the above.

## 2019-08-25 ENCOUNTER — Other Ambulatory Visit: Payer: Self-pay

## 2019-08-25 ENCOUNTER — Inpatient Hospital Stay: Payer: 59 | Attending: Hematology and Oncology | Admitting: Hematology and Oncology

## 2019-08-25 DIAGNOSIS — Z86718 Personal history of other venous thrombosis and embolism: Secondary | ICD-10-CM | POA: Diagnosis present

## 2019-08-25 DIAGNOSIS — R7303 Prediabetes: Secondary | ICD-10-CM | POA: Diagnosis not present

## 2019-08-25 DIAGNOSIS — Z86711 Personal history of pulmonary embolism: Secondary | ICD-10-CM

## 2019-08-25 MED ORDER — TRAMADOL HCL 50 MG PO TABS: 50.0000 mg | ORAL_TABLET | Freq: Four times a day (QID) | ORAL | Status: DC | PRN

## 2019-08-25 NOTE — Assessment & Plan Note (Signed)
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  Should tells me that she took Xarelto for 1 year  Current surgical plan: Left shoulder rotator cuff surgery Caprini score: 8 (based on prior history of blood clots, obesity, age and lower extremity edema) This translates to a 4% risk of DVT Recommendation: Postoperatively patient will need 10 days of full anticoagulation.  This could be with DOAC or Lovenox.  If the shoulder cannot be ambulated early then she might need longer duration of anticoagulation. I sent a message to her physicians regarding her recommendations. Return to clinic on an as-needed basis.

## 2019-08-28 ENCOUNTER — Other Ambulatory Visit: Payer: Self-pay

## 2019-08-28 ENCOUNTER — Encounter: Payer: Self-pay | Admitting: Cardiology

## 2019-08-28 ENCOUNTER — Ambulatory Visit: Payer: 59 | Admitting: Cardiology

## 2019-08-28 ENCOUNTER — Telehealth: Payer: Self-pay

## 2019-08-28 VITALS — BP 142/52 | HR 71 | Ht 63.5 in | Wt 262.0 lb

## 2019-08-28 DIAGNOSIS — R9431 Abnormal electrocardiogram [ECG] [EKG]: Secondary | ICD-10-CM

## 2019-08-28 DIAGNOSIS — Z86711 Personal history of pulmonary embolism: Secondary | ICD-10-CM

## 2019-08-28 DIAGNOSIS — R7303 Prediabetes: Secondary | ICD-10-CM

## 2019-08-28 DIAGNOSIS — Z86718 Personal history of other venous thrombosis and embolism: Secondary | ICD-10-CM

## 2019-08-28 DIAGNOSIS — Z6841 Body Mass Index (BMI) 40.0 and over, adult: Secondary | ICD-10-CM

## 2019-08-28 DIAGNOSIS — I10 Essential (primary) hypertension: Secondary | ICD-10-CM

## 2019-08-28 DIAGNOSIS — Z01810 Encounter for preprocedural cardiovascular examination: Secondary | ICD-10-CM

## 2019-08-28 MED ORDER — AMLODIPINE BESYLATE 5 MG PO TABS: 5.0000 mg | ORAL_TABLET | Freq: Every morning | ORAL | 0 refills | Status: DC

## 2019-08-28 NOTE — Telephone Encounter (Signed)
-----   Message from Girtha Rm, NP-C sent at 08/28/2019  8:06 AM EDT ----- She has an appointment today with cardiology for surgical clearance. Per Dr. Lindi Adie her hematologist, she should be on Xarelto after her surgery due to her history of DVT and PE. Please ask her to discuss this with her cardiologist today. We need to find out who is going to manage this. Thanks.  ----- Message ----- From: Nicholas Lose, MD Sent: 08/25/2019   3:58 PM EDT To: Girtha Rm, NP-C

## 2019-08-28 NOTE — Progress Notes (Signed)
Date:  08/28/2019   ID:  [name], DOB 1961-02-15, MRN [id number]  PCP:  Girtha Rm, NP-C  Cardiologist:  Rex Kras, DO, [facility] (established care 08/28/2019)  REASON FOR CONSULT: Preoperative risk stratification  REQUESTING PHYSICIAN:  Girtha Rm, NP-C [place] [address]  Chief Complaint  Patient presents with  . New Patient (Initial Visit)  . Pre-op Exam    HPI  JUDI JAFFE is a 59 y.o. female who is being seen today for the evaluation of preoperative risk stratification at the request of Henson, Vickie L, NP-C. Patient's past medical history and cardiac risk factors include: Hypertension, history of DVT and PE, obesity due to excess calories, prediabetes.  Patient is planning to have left shoulder surgery with Dr. French Ana surgical date to be determined. No active chest pain or or anginal equivalent. Patient is euvolemic and no history of congestive heart failure. No prior history of cardiac arrhythmias. No history of valvular heart disease. Patient is a prediabetic. Serum creatinine is less than 2 mg/dL History of DVT and PE 2015 unprovoked.  Functional status is limited to her daily routine.  Patient provides janitorial services.  No structured exercise program or daily routine.  Family history of premature coronary disease or sudden cardiac death.  History of DVT and PE in 2015.  Denies prior history of coronary artery disease, myocardial infarction, congestive heart failure, stroke, transient ischemic attack.  ALLERGIES: No Known Allergies  MEDICATION LIST PRIOR TO VISIT: Current Meds  Medication Sig  . acetaminophen (TYLENOL) 500 MG tablet Take 500 mg by mouth every 6 (six) hours as needed.  . meloxicam (MOBIC) 7.5 MG tablet Take 1 tablet (7.5 mg total) by mouth daily.  . traMADol (ULTRAM) 50 MG tablet Take 1 tablet (50 mg total) by mouth every 6 (six) hours as needed.     PAST MEDICAL HISTORY: Past Medical  History:  Diagnosis Date  . DJD (degenerative joint disease)   . DVT (deep venous thrombosis) ([place])   . History of febrile seizure   . PE (pulmonary thromboembolism) ([place])   . Prediabetes 08/11/2019    PAST SURGICAL HISTORY: Past Surgical History:  Procedure Laterality Date  . KNEE SURGERY     Right    FAMILY HISTORY: The patient family history includes Diabetes in her father and mother; Heart disease in her mother; Hypertension in her father and mother.  SOCIAL HISTORY:  The patient  reports that she has never smoked. She has never used smokeless tobacco. She reports current alcohol use. She reports that she does not use drugs.  REVIEW OF SYSTEMS: Review of Systems  Constitutional: Negative for chills and fever.  HENT: Negative for hoarse voice and nosebleeds.   Eyes: Negative for discharge, double vision and pain.  Cardiovascular: Positive for leg swelling (left more than right (chronic)). Negative for chest pain, claudication, dyspnea on exertion, near-syncope, orthopnea, palpitations, paroxysmal nocturnal dyspnea and syncope.  Respiratory: Negative for hemoptysis and shortness of breath.   Musculoskeletal: Positive for arthritis and joint pain. Negative for muscle cramps and myalgias.  Gastrointestinal: Negative for abdominal pain, constipation, diarrhea, hematemesis, hematochezia, melena, nausea and vomiting.  Neurological: Negative for dizziness and light-headedness.    PHYSICAL EXAM: Vitals with BMI 08/28/2019 08/25/2019 08/10/2019  Height 5' 3.5" 5' 3.5" 5' 3.5"  Weight 262 lbs 258 lbs 14 oz 257 lbs  BMI 45.68 17.51 02.58  Systolic 527 782 423  Diastolic 52 63 70  Pulse 71 66 61  CONSTITUTIONAL: Well-developed and well-nourished. No acute distress.  SKIN: Skin is warm and dry. No rash noted. No cyanosis. No pallor. No jaundice HEAD: Normocephalic and atraumatic.  EYES: No scleral icterus MOUTH/THROAT: Moist oral membranes.  NECK: No JVD present. No thyromegaly  noted. No carotid bruits  LYMPHATIC: No visible cervical adenopathy.  CHEST Normal respiratory effort. No intercostal retractions  LUNGS: Clear to auscultation bilaterally.  No stridor. No wheezes. No rales.  CARDIOVASCULAR: Regular rate and rhythm, positive S1-S2, no murmurs rubs or gallops appreciated. ABDOMINAL: No apparent ascites.  EXTREMITIES: No peripheral edema  HEMATOLOGIC: No significant bruising NEUROLOGIC: Oriented to person, place, and time. Nonfocal. Normal muscle tone.  PSYCHIATRIC: Normal mood and affect. Normal behavior. Cooperative  RADIOLOGY: CTA chest PE protocol 06/14/2013: 1. Bilateral lower lobe pulmonary emboli, left greater than right. At least 1 right upper lobe embolus is present as well.  2. Bilateral lower lobe airspace disease, left greater than right. This could in part be related to the emboli.  3. Moderate left-sided pleural effusion.   CARDIAC DATABASE: EKG: 08/28/2019:Sinus  Rhythm, 71bpm, left atrial enlargement, IRBBB, ST-T changes possibly due to IRBBB but underlying ischemia cannot be ruled out.  Echocardiogram: 06/24/2013: LVEF 60-65 %, no regional wall motion abnormalities, grade 1 diastolic impairment, mild PR, trivial TR.  Stress Testing: None  Heart Catheterization: None  Vascular imaging: Lower extremity venous duplex0 06/24/2013: - No evidence of deep vein thrombosis involving the right lower extremity.  - Findings consistent with deep vein thrombosis involving the left femoral vein, left popliteal vein, left posterial tibial vein, left peroneal vein, and left gastrocnemius vein.  - Superficial vein thrombosis involving the left small saphenous vein.   LABORATORY DATA: CBC Latest Ref Rng & Units 08/10/2019 06/24/2013 06/23/2013  WBC 3.4 - 10.8 x10E3/uL 4.5 7.1 9.3  Hemoglobin 11.1 - 15.9 g/dL 13.6 8.2(L) 8.5(L)  Hematocrit 34.0 - 46.6 % 41.3 26.5(L) 27.2(L)  Platelets 150 - 450 x10E3/uL 201 301 294    CMP Latest Ref Rng & Units  08/10/2019 06/24/2013 06/23/2013  Glucose 65 - 99 mg/dL 88 91 119(H)  BUN 6 - 24 mg/dL 15 4(L) 7  Creatinine 0.57 - 1.00 mg/dL 0.73 0.70 0.73  Sodium 134 - 144 mmol/L 137 139 138  Potassium 3.5 - 5.2 mmol/L 4.3 3.9 4.2  Chloride 96 - 106 mmol/L 105 104 103  CO2 20 - 29 mmol/L 21 22 22   Calcium 8.7 - 10.2 mg/dL 9.2 8.8 9.2  Total Protein 6.0 - 8.5 g/dL 6.7 - -  Total Bilirubin 0.0 - 1.2 mg/dL 0.6 - -  Alkaline Phos 48 - 121 IU/L 47(L) - -  AST 0 - 40 IU/L 19 - -  ALT 0 - 32 IU/L 13 - -    Lipid Panel     Component Value Date/Time   CHOL 167 08/10/2019 1155   TRIG 43 08/10/2019 1155   HDL 51 08/10/2019 1155   CHOLHDL 3.3 08/10/2019 1155   CHOLHDL 3.7 06/24/2013 0529   VLDL 14 06/24/2013 0529   LDLCALC 107 (H) 08/10/2019 1155   LABVLDL 9 08/10/2019 1155   Recent Labs    08/10/19 1155  TSH 2.860   HEMOGLOBIN A1C Lab Results  Component Value Date   HGBA1C 5.7 (H) 08/10/2019   MPG 123 (H) 06/23/2013   IMPRESSION:    ICD-10-CM   1. Preoperative cardiovascular examination  Z01.810 PCV ECHOCARDIOGRAM COMPLETE    PCV MYOCARDIAL PERFUSION WITH LEXISCAN  2. Prediabetes  R73.03   3. Benign  hypertension  I10 amLODipine (NORVASC) 5 MG tablet  4. Nonspecific abnormal electrocardiogram (ECG) (EKG)  R94.31 PCV MYOCARDIAL PERFUSION WITH LEXISCAN  5. History of pulmonary embolism  Z86.711 EKG 12-Lead  6. History of deep venous thrombosis  Z86.718 EKG 12-Lead  7. Class 3 severe obesity due to excess calories with serious comorbidity and body mass index (BMI) of 45.0 to 49.9 in adult [facility])  E66.01    Z68.42      RECOMMENDATIONS: MAURIE MUSCO is a 59 y.o. female whose past medical history and cardiac risk factors include:  Hypertension, history of DVT and PE, obesity due to excess calories, prediabetes.  Preoperative risk stratification:  Patient is planned to undergo orthopedic surgery with Dr. French Ana, date to be determined.  EKG shows normal sinus rhythm with ST-T changes  mostly secondary to incomplete right bundle branch block, but underlying ischemia cannot be ruled out. Since patient is not functionally very active would recommend an ischemic evaluation prior to upcoming surgery.  Echocardiogram will be ordered to evaluate for structural heart disease and left ventricular systolic function.  Nuclear stress test recommended to evaluate for reversible ischemia.  Benign essential hypertension:  Start amlodipine 5 mg p.o. every morning  Low-salt diet recommended  Patient is asked to call the office if her systolic blood pressures are greater than 140 mmHg.  Prediabetes: Currently managed by primary team.  History of DVT and PE back in 2015 unprovoked: Currently managed by hematology and primary team.  Patient is recommended to have age-appropriate cancer screening under the care of her primary care physician.   FINAL MEDICATION LIST END OF ENCOUNTER: Meds ordered this encounter  Medications  . amLODipine (NORVASC) 5 MG tablet    Sig: Take 1 tablet (5 mg total) by mouth in the morning.    Dispense:  90 tablet    Refill:  0    There are no discontinued medications.   Current Outpatient Medications:  .  acetaminophen (TYLENOL) 500 MG tablet, Take 500 mg by mouth every 6 (six) hours as needed., Disp: , Rfl:  .  meloxicam (MOBIC) 7.5 MG tablet, Take 1 tablet (7.5 mg total) by mouth daily., Disp: 15 tablet, Rfl: 0 .  traMADol (ULTRAM) 50 MG tablet, Take 1 tablet (50 mg total) by mouth every 6 (six) hours as needed., Disp: 30 tablet, Rfl:  .  amLODipine (NORVASC) 5 MG tablet, Take 1 tablet (5 mg total) by mouth in the morning., Disp: 90 tablet, Rfl: 0  Orders Placed This Encounter  Procedures  . PCV MYOCARDIAL PERFUSION WITH LEXISCAN  . EKG 12-Lead  . PCV ECHOCARDIOGRAM COMPLETE    There are no Patient Instructions on file for this visit.   --Continue cardiac medications as reconciled in final medication list. --Return in about 3 months (around  11/28/2019) for re-evaluation of symptoms.. Or sooner if needed. --Continue follow-up with your primary care physician regarding the management of your other chronic comorbid conditions.  Patient's questions and concerns were addressed to her satisfaction. She voices understanding of the instructions provided during this encounter.   This note was created using a voice recognition software as a result there may be grammatical errors inadvertently enclosed that do not reflect the nature of this encounter. Every attempt is made to correct such errors.  Rex Kras, [place], [facility]  Pager: [phone] Office: [phone]

## 2019-08-28 NOTE — Progress Notes (Signed)
[facility] was called and notes will be on epic. Rise Paganini advised that Xarelto will more than likely be managed by their office . [place]

## 2019-08-28 NOTE — Telephone Encounter (Signed)
See message below. KH 

## 2019-08-29 NOTE — Progress Notes (Signed)
Per her cardiology visit, they will not be managing her anticoagulant following surgery.  Please call her orthopedic surgeon's office to ask if Dr. French Ana will be willing to prescribe the 10 days of post operative anticoagulant recommended by Dr. Lindi Adie, hematologist.  If not, she will need to have this prescribed and ready to start on the day after surgery.

## 2019-08-30 NOTE — Progress Notes (Signed)
LVM for [facility] and left message. [place]

## 2019-08-31 ENCOUNTER — Telehealth: Payer: Self-pay

## 2019-08-31 NOTE — Telephone Encounter (Signed)
Ortho was advised of management of anticoagulant. Dr. Lorenz Coaster was sent a message. [place]

## 2019-09-01 ENCOUNTER — Ambulatory Visit: Payer: 59

## 2019-09-01 ENCOUNTER — Other Ambulatory Visit: Payer: Self-pay

## 2019-09-01 DIAGNOSIS — Z01810 Encounter for preprocedural cardiovascular examination: Secondary | ICD-10-CM

## 2019-09-04 ENCOUNTER — Encounter: Payer: Self-pay | Admitting: Family Medicine

## 2019-09-04 NOTE — Progress Notes (Signed)
Called Dr. French Ana office and spoke to [place] and she will put a message in to him about if he will prescribe the medicine. Pt has not heard anything about schedule surgery yet and no appt is schedule per ortho office

## 2019-09-05 ENCOUNTER — Telehealth: Payer: Self-pay | Admitting: Internal Medicine

## 2019-09-05 NOTE — Progress Notes (Signed)
Called and spoke with patient regarding Her echocardiogram results.

## 2019-09-05 NOTE — Telephone Encounter (Signed)
error 

## 2019-09-05 NOTE — Progress Notes (Signed)
Joyce Alexander called back and said that they would prescribe the anticoagulate after surgery

## 2019-09-11 ENCOUNTER — Ambulatory Visit: Payer: 59

## 2019-09-11 ENCOUNTER — Other Ambulatory Visit: Payer: Self-pay

## 2019-09-11 DIAGNOSIS — Z01810 Encounter for preprocedural cardiovascular examination: Secondary | ICD-10-CM

## 2019-09-11 DIAGNOSIS — R9431 Abnormal electrocardiogram [ECG] [EKG]: Secondary | ICD-10-CM

## 2019-09-11 NOTE — Progress Notes (Signed)
Please let her know that we were told her orthopedic surgeon would start her on an anticoagulant (to thin her blood) after her surgery due to her history of blood clots. They will let her know how long she should take this as well. If she has any questions or they do not do this, she will need to let me know right away.

## 2019-09-11 NOTE — Progress Notes (Signed)
Left detailed message on pt's phone about ortho doing blood thinner

## 2019-10-16 ENCOUNTER — Telehealth: Payer: Self-pay | Admitting: Internal Medicine

## 2019-10-16 NOTE — Telephone Encounter (Signed)
I have faxed back form and put on there per note in June Dr. French Ana agreed to prescribe anticoagulation 10 day course.

## 2019-10-16 NOTE — Telephone Encounter (Signed)
I am ok with clearing her for surgery as long as the cardiologist and hematologist have given her clearance. Please see previous note regarding her orthopedist agreeing to manage her anticoagulation around her surgery as recommend by her hematologist. Please let me know if there are any questions about this.

## 2019-10-16 NOTE — Telephone Encounter (Signed)
Pt was seen back in may for surgery clearance and they have clearance from hematology and cardiology but need medical clearance from pcp with continue with surgery. Ok to clear her

## 2019-11-19 ENCOUNTER — Other Ambulatory Visit: Payer: Self-pay | Admitting: Cardiology

## 2019-11-19 DIAGNOSIS — I10 Essential (primary) hypertension: Secondary | ICD-10-CM

## 2019-11-29 ENCOUNTER — Encounter: Payer: Self-pay | Admitting: Cardiology

## 2019-11-29 ENCOUNTER — Other Ambulatory Visit: Payer: Self-pay

## 2019-11-29 ENCOUNTER — Ambulatory Visit: Payer: 59 | Admitting: Cardiology

## 2019-11-29 VITALS — BP 135/59 | HR 61 | Resp 16 | Ht 63.0 in | Wt 285.0 lb

## 2019-11-29 DIAGNOSIS — Z86718 Personal history of other venous thrombosis and embolism: Secondary | ICD-10-CM

## 2019-11-29 DIAGNOSIS — I10 Essential (primary) hypertension: Secondary | ICD-10-CM

## 2019-11-29 DIAGNOSIS — Z86711 Personal history of pulmonary embolism: Secondary | ICD-10-CM

## 2019-11-29 DIAGNOSIS — Z6841 Body Mass Index (BMI) 40.0 and over, adult: Secondary | ICD-10-CM

## 2019-11-29 DIAGNOSIS — R7303 Prediabetes: Secondary | ICD-10-CM

## 2019-11-29 MED ORDER — HYDROCHLOROTHIAZIDE 12.5 MG PO CAPS: 12.5000 mg | ORAL_CAPSULE | Freq: Every morning | ORAL | 0 refills | Status: DC

## 2019-11-29 NOTE — Progress Notes (Signed)
Date:  11/29/2019   ID:  Joyce Alexander, DOB [birth year], MRN [id number]  PCP:  Girtha Rm, NP-C  Cardiologist:  Rex Kras, DO, [facility] (established care 08/28/2019)  Chief Complaint  Patient presents with  . Follow-up    3 month   . Results    HPI  Joyce Alexander is a 59 y.o. female who is being seen today with a chief complaint of "review test results and postop follow-up."  Patient's past medical history and cardiac risk factors include: Hypertension, history of DVT and PE, obesity due to excess calories, prediabetes.  Since last office visit patient has undergone a left shoulder surgery on November 08, 2019.  The surgery went well.  She is recovering as planned according to the patient.  She starts physical therapy next month.  She denies any chest pain or shortness of breath at rest or with effort related activities.  Patient was started on antihypertensive medications prior to her surgery.  She was started on amlodipine but states that it was causing her lower extremity swelling.  She is asking to be on an alternative antihypertensive medication.  Family history of premature coronary disease or sudden cardiac death.  History of DVT and PE in 2015.  Denies prior history of coronary artery disease, myocardial infarction, congestive heart failure, stroke, transient ischemic attack.  ALLERGIES: No Known Allergies  MEDICATION LIST PRIOR TO VISIT: Current Meds  Medication Sig  . acetaminophen (TYLENOL) 500 MG tablet Take 500 mg by mouth every 6 (six) hours as needed.  Marland Kitchen aspirin EC 81 MG tablet Take 81 mg by mouth daily. Swallow whole.  Marland Kitchen ELIQUIS 2.5 MG TABS tablet Take by mouth.  . methocarbamol (ROBAXIN) 500 MG tablet Take 500 mg by mouth as needed.   Marland Kitchen oxyCODONE (OXY IR/ROXICODONE) 5 MG immediate release tablet PLEASE SEE ATTACHED FOR DETAILED DIRECTIONS  . [DISCONTINUED] amLODipine (NORVASC) 5 MG tablet TAKE 1 TABLET (5 MG TOTAL) BY MOUTH IN THE MORNING.     PAST  MEDICAL HISTORY: Past Medical History:  Diagnosis Date  . DJD (degenerative joint disease)   . DVT (deep venous thrombosis) ([place])   . History of febrile seizure   . Hypertension   . left   . PE (pulmonary thromboembolism) ([place])   . Prediabetes 08/11/2019    PAST SURGICAL HISTORY: Past Surgical History:  Procedure Laterality Date  . KNEE SURGERY     Right    FAMILY HISTORY: The patient family history includes Diabetes in her father and mother; Heart disease in her mother; Hypertension in her father and mother.  SOCIAL HISTORY:  The patient  reports that she has never smoked. She has never used smokeless tobacco. She reports current alcohol use. She reports that she does not use drugs.  REVIEW OF SYSTEMS: Review of Systems  Constitutional: Negative for chills and fever.  HENT: Negative for hoarse voice and nosebleeds.   Eyes: Negative for discharge, double vision and pain.  Cardiovascular: Positive for leg swelling (bilateral (chronic)). Negative for chest pain, claudication, dyspnea on exertion, near-syncope, orthopnea, palpitations, paroxysmal nocturnal dyspnea and syncope.  Respiratory: Negative for hemoptysis and shortness of breath.   Musculoskeletal: Positive for arthritis and joint pain. Negative for muscle cramps and myalgias.  Gastrointestinal: Negative for abdominal pain, constipation, diarrhea, hematemesis, hematochezia, melena, nausea and vomiting.  Neurological: Negative for dizziness and light-headedness.    PHYSICAL EXAM: Vitals with BMI 11/29/2019 08/28/2019 08/25/2019  Height 5\' 3"  5' 3.5" 5' 3.5"  Weight 285 lbs  262 lbs 258 lbs 14 oz  BMI 50.5 38.46 65.99  Systolic 357 017 793  Diastolic 59 52 63  Pulse 61 71 66    CONSTITUTIONAL: Well-developed and well-nourished. No acute distress.  SKIN: Skin is warm and dry. No rash noted. No cyanosis. No pallor. No jaundice HEAD: Normocephalic and atraumatic.  EYES: No scleral icterus MOUTH/THROAT: Moist oral  membranes.  NECK: No JVD present. No thyromegaly noted. No carotid bruits  LYMPHATIC: No visible cervical adenopathy.  CHEST Normal respiratory effort. No intercostal retractions  LUNGS: Clear to auscultation bilaterally.  No stridor. No wheezes. No rales.  CARDIOVASCULAR: Regular rate and rhythm, positive S1-S2, no murmurs rubs or gallops appreciated. ABDOMINAL: No apparent ascites.  EXTREMITIES: Trace bilateral pitting edema  HEMATOLOGIC: No significant bruising NEUROLOGIC: Oriented to person, place, and time. Nonfocal. Normal muscle tone.  PSYCHIATRIC: Normal mood and affect. Normal behavior. Cooperative  RADIOLOGY: CTA chest PE protocol 06/14/2013: 1. Bilateral lower lobe pulmonary emboli, left greater than right. At least 1 right upper lobe embolus is present as well.  2. Bilateral lower lobe airspace disease, left greater than right. This could in part be related to the emboli.  3. Moderate left-sided pleural effusion.   CARDIAC DATABASE: EKG: 08/28/2019:Sinus  Rhythm, 71bpm, left atrial enlargement, IRBBB, ST-T changes possibly due to IRBBB but underlying ischemia cannot be ruled out.  Echocardiogram: 09/01/2019:  Normal LV systolic function with visual EF 60-65%. Left ventricle cavity is normal in size. Mild left ventricular hypertrophy. Normal global wall motion. Normal diastolic filling pattern, normal LAP. Calculated EF 66%.  Mild (Grade I) mitral regurgitation.  Mild tricuspid regurgitation.  Mild pulmonic regurgitation.  No prior study for comparison.   Stress Testing: Lexiscan (Walking with mod Bruce) Sestamibi Stress Test 09/11/2019:  Nondiagnostic ECG stress. Resting EKG/ECG demonstrated normal sinus rhythm. Less than 1 mm ST depression of the inferolateral leads.  Peak EKG/ECG revealed no significant ST-T change from baseline abnormality with Lexiscan stress using Mod Bruce Protocol.  Myocardial perfusion is normal.  Overall LV systolic function is normal without  regional wall motion abnormalities. Stress LV EF: 67%.  No previous exam available for comparison. Low risk.   Heart Catheterization: None  Vascular imaging: Lower extremity venous duplex0 06/24/2013: - No evidence of deep vein thrombosis involving the right lower extremity.  - Findings consistent with deep vein thrombosis involving the left femoral vein, left popliteal vein, left posterial tibial vein, left peroneal vein, and left gastrocnemius vein.  - Superficial vein thrombosis involving the left small saphenous vein.   LABORATORY DATA: CBC Latest Ref Rng & Units 08/10/2019 06/24/2013 06/23/2013  WBC 3.4 - 10.8 x10E3/uL 4.5 7.1 9.3  Hemoglobin 11.1 - 15.9 g/dL 13.6 8.2(L) 8.5(L)  Hematocrit 34.0 - 46.6 % 41.3 26.5(L) 27.2(L)  Platelets 150 - 450 x10E3/uL 201 301 294    CMP Latest Ref Rng & Units 08/10/2019 06/24/2013 06/23/2013  Glucose 65 - 99 mg/dL 88 91 119(H)  BUN 6 - 24 mg/dL 15 4(L) 7  Creatinine 0.57 - 1.00 mg/dL 0.73 0.70 0.73  Sodium 134 - 144 mmol/L 137 139 138  Potassium 3.5 - 5.2 mmol/L 4.3 3.9 4.2  Chloride 96 - 106 mmol/L 105 104 103  CO2 20 - 29 mmol/L 21 22 22   Calcium 8.7 - 10.2 mg/dL 9.2 8.8 9.2  Total Protein 6.0 - 8.5 g/dL 6.7 - -  Total Bilirubin 0.0 - 1.2 mg/dL 0.6 - -  Alkaline Phos 48 - 121 IU/L 47(L) - -  AST 0 -  40 IU/L 19 - -  ALT 0 - 32 IU/L 13 - -    Lipid Panel     Component Value Date/Time   CHOL 167 08/10/2019 1155   TRIG 43 08/10/2019 1155   HDL 51 08/10/2019 1155   CHOLHDL 3.3 08/10/2019 1155   CHOLHDL 3.7 06/24/2013 0529   VLDL 14 06/24/2013 0529   LDLCALC 107 (H) 08/10/2019 1155   LABVLDL 9 08/10/2019 1155   Recent Labs    08/10/19 1155  TSH 2.860   HEMOGLOBIN A1C Lab Results  Component Value Date   HGBA1C 5.7 (H) 08/10/2019   MPG 123 (H) 06/23/2013   IMPRESSION:    ICD-10-CM   1. Prediabetes  R73.03   2. Benign hypertension  I10 hydrochlorothiazide (MICROZIDE) 12.5 MG capsule    Basic metabolic panel    Magnesium     Magnesium    Basic metabolic panel  3. History of pulmonary embolism  Z86.711   4. History of deep venous thrombosis  Z86.718   5. Class 3 severe obesity due to excess calories with serious comorbidity and body mass index (BMI) of 50.0 to 59.9 in adult [facility])  E66.01    Z68.43      RECOMMENDATIONS: ADDYSEN LOUTH is a 59 y.o. female whose past medical history and cardiac risk factors include:  Hypertension, history of DVT and PE, obesity due to excess calories, prediabetes.  Benign essential hypertension:  Continue amlodipine 5 mg p.o. every morning  Start hydrochlorothiazide 12.5 mg p.o. every morning  Blood work in 1 week to evaluate kidney function electrolytes  And intervene on her blood pressure as she was planning to undergo orthopedic surgery.  Long-term blood pressure management deferred to primary team per patient's request.  Low-salt diet recommended  Patient is asked to call the office if her systolic blood pressures are greater than 140 mmHg.  Reviewed the results of the echocardiogram and stress test with the patient at today's office visit findings noted above for further reference.  No additional cardiovascular work-up warranted at this time.  Obesity, due to excess calories: . Body mass index is 50.49 kg/m.  Marland Kitchen Patient has gained weight since last office visit which she is attributing decrease physical activity due to recent surgery.   . I reviewed with the patient the importance of diet, regular physical activity/exercise, weight loss.   . Patient is educated on increasing physical activity gradually as tolerated.  With the goal of moderate intensity exercise for 30 minutes a day 5 days a week.  Prediabetes: Currently managed by primary team.  History of DVT and PE back in 2015 unprovoked: Currently managed by hematology and primary team.  Patient is recommended to have age-appropriate cancer screening under the care of her primary care physician.   Patient was  referred to me for preoperative risk stratification.  I will defer her care back to her PCP at this time and I will see her on as needed basis.  Patient is asked to see me back sooner in 3 months if she would like me to follow her antihypertensive medications.  Will defer to PCP and patient's choice.  FINAL MEDICATION LIST END OF ENCOUNTER: Meds ordered this encounter  Medications  . hydrochlorothiazide (MICROZIDE) 12.5 MG capsule    Sig: Take 1 capsule (12.5 mg total) by mouth in the morning.    Dispense:  30 capsule    Refill:  0    Medications Discontinued During This Encounter  Medication Reason  .  meloxicam (MOBIC) 7.5 MG tablet Discontinued by provider  . traMADol (ULTRAM) 50 MG tablet Discontinued by provider  . amLODipine (NORVASC) 5 MG tablet Discontinued by provider     Current Outpatient Medications:  .  acetaminophen (TYLENOL) 500 MG tablet, Take 500 mg by mouth every 6 (six) hours as needed., Disp: , Rfl:  .  aspirin EC 81 MG tablet, Take 81 mg by mouth daily. Swallow whole., Disp: , Rfl:  .  ELIQUIS 2.5 MG TABS tablet, Take by mouth., Disp: , Rfl:  .  methocarbamol (ROBAXIN) 500 MG tablet, Take 500 mg by mouth as needed. , Disp: , Rfl:  .  oxyCODONE (OXY IR/ROXICODONE) 5 MG immediate release tablet, PLEASE SEE ATTACHED FOR DETAILED DIRECTIONS, Disp: , Rfl:  .  hydrochlorothiazide (MICROZIDE) 12.5 MG capsule, Take 1 capsule (12.5 mg total) by mouth in the morning., Disp: 30 capsule, Rfl: 0  Orders Placed This Encounter  Procedures  . Basic metabolic panel  . Magnesium    There are no Patient Instructions on file for this visit.   --Continue cardiac medications as reconciled in final medication list. --Return in about 3 months (around 02/28/2020), or if symptoms worsen or fail to improve, for BP. Or sooner if needed. --Continue follow-up with your primary care physician regarding the management of your other chronic comorbid conditions.  Patient's questions and  concerns were addressed to her satisfaction. She voices understanding of the instructions provided during this encounter.   This note was created using a voice recognition software as a result there may be grammatical errors inadvertently enclosed that do not reflect the nature of this encounter. Every attempt is made to correct such errors.  Total time spent: 30 minutes  Mechele Claude [facility]  Pager: [phone] Office: [phone]

## 2019-12-23 ENCOUNTER — Other Ambulatory Visit: Payer: Self-pay | Admitting: Cardiology

## 2019-12-23 DIAGNOSIS — I10 Essential (primary) hypertension: Secondary | ICD-10-CM

## 2020-02-21 ENCOUNTER — Other Ambulatory Visit: Payer: Self-pay | Admitting: Cardiology

## 2020-02-21 DIAGNOSIS — I10 Essential (primary) hypertension: Secondary | ICD-10-CM

## 2020-02-28 ENCOUNTER — Ambulatory Visit: Payer: 59 | Admitting: Cardiology

## 2020-10-02 IMAGING — DX DG HUMERUS 2V *L*
3 series · 3 of 3 positions shown · non-contrast
Comparison: None.

CLINICAL DATA: Worsening soreness of the left arm after receiving
COVID vaccine, difficulty moving arm.

EXAM:
LEFT HUMERUS - 2+ VIEW

[humerus ap]
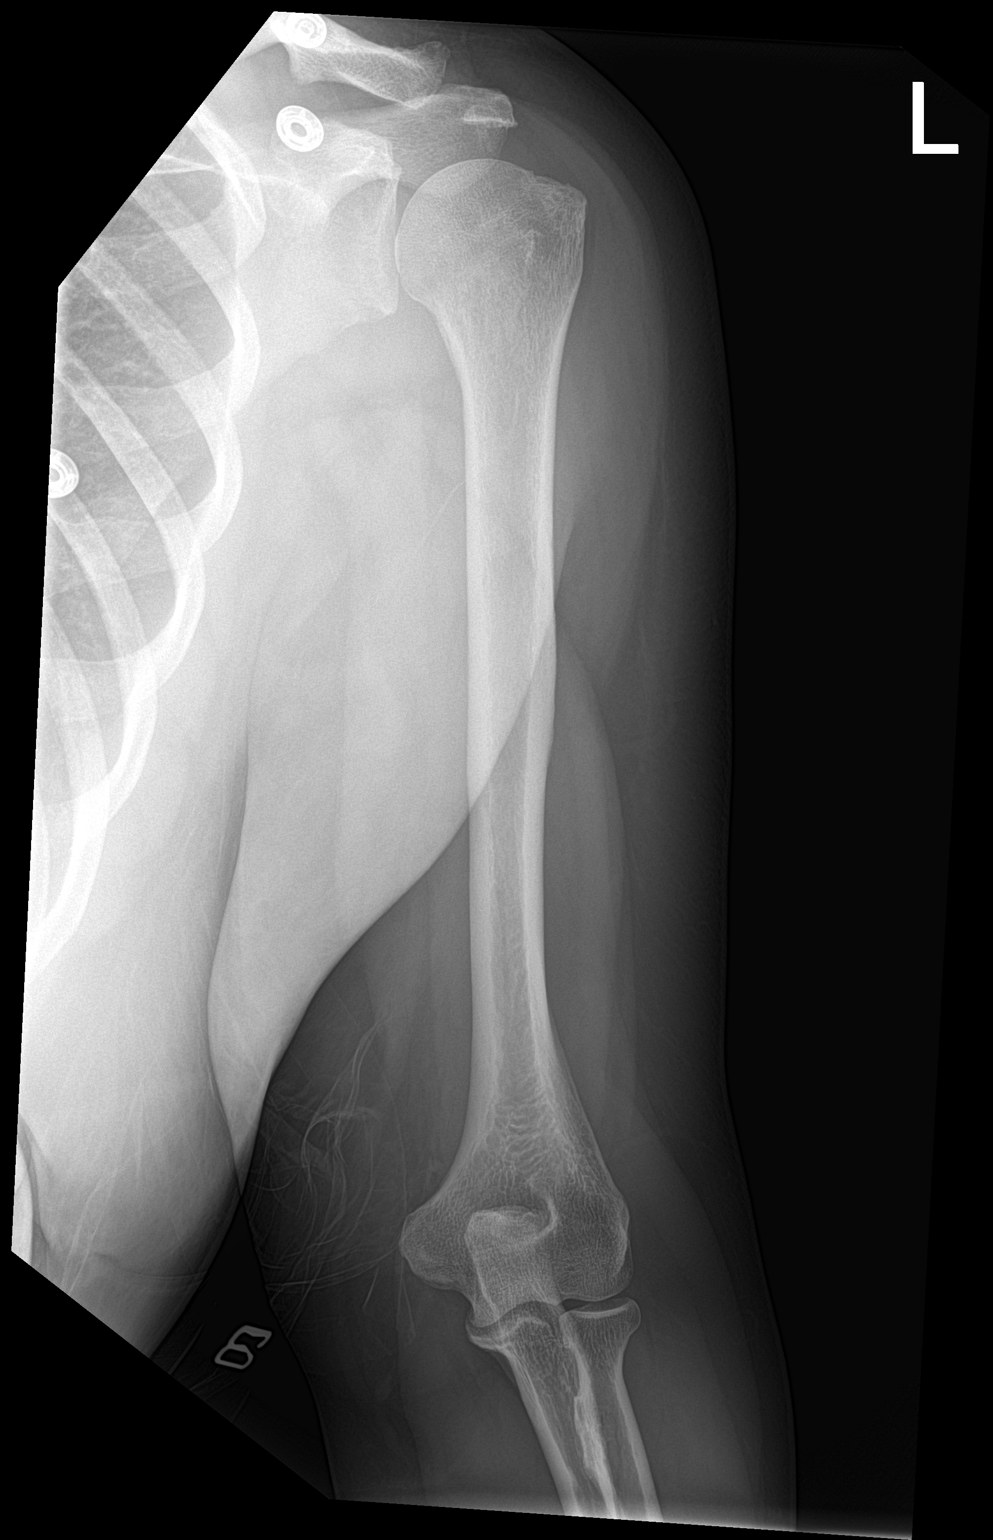

[humerus lat (1 of 2)]
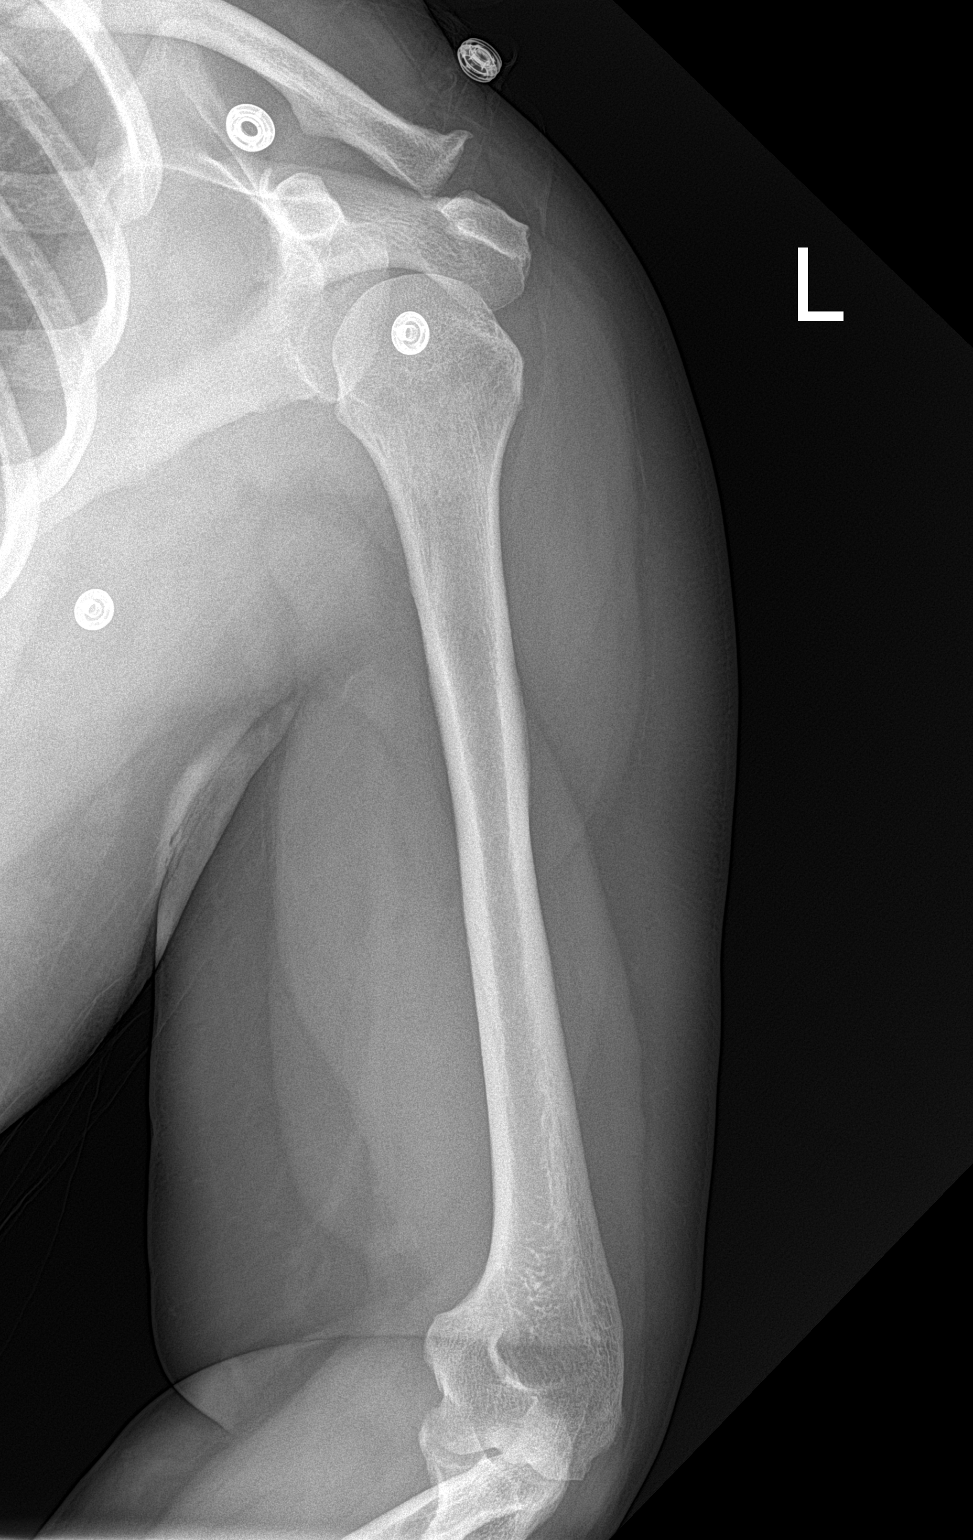

[humerus lat (2 of 2)]
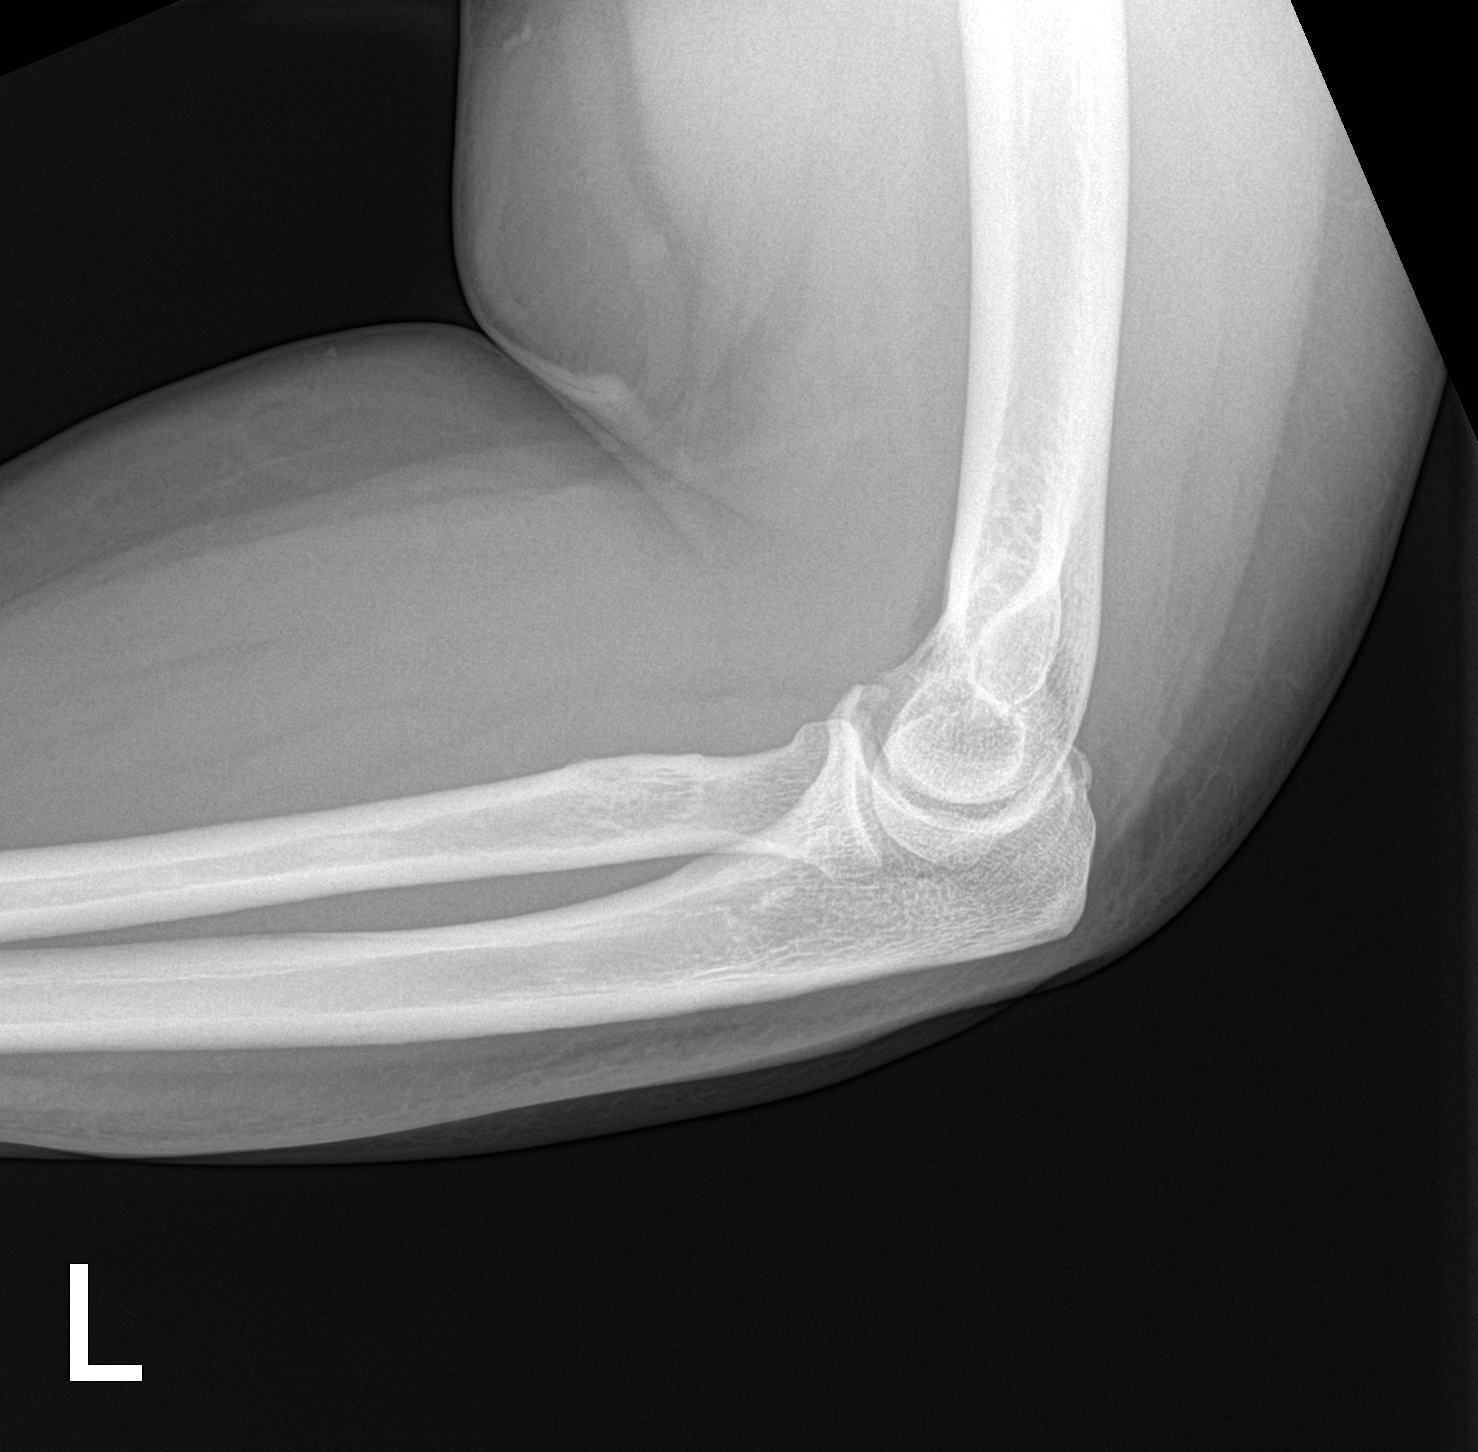

[3 of 3 positions shown; findings below may reference images not displayed]

FINDINGS: No gas identified in the soft tissues. Mild spurring of the AC
joint. No elbow joint effusion. There is mild spurring of the
coronoid process in the elbow.
IMPRESSION: 1. Mild spurring of the coronoid process and AC joint.
2. No gas in the soft tissues.
3. Please note that typically, Seraphin syndrome will not be
visible on radiography.

## 2021-02-19 ENCOUNTER — Ambulatory Visit (HOSPITAL_COMMUNITY)
Admission: EM | Admit: 2021-02-19 | Discharge: 2021-02-19 | Disposition: A | Discharge status: Home / Self Care | Payer: 59 | Attending: Emergency Medicine | Admitting: Emergency Medicine

## 2021-02-19 ENCOUNTER — Encounter (HOSPITAL_COMMUNITY): Payer: Self-pay

## 2021-02-19 ENCOUNTER — Other Ambulatory Visit: Payer: Self-pay

## 2021-02-19 DIAGNOSIS — M5441 Lumbago with sciatica, right side: Secondary | ICD-10-CM | POA: Diagnosis not present

## 2021-02-19 MED ORDER — METHYLPREDNISOLONE 4 MG PO TBPK: ORAL_TABLET | Freq: Every day | ORAL | 0 refills | Status: DC

## 2021-02-19 MED ORDER — TIZANIDINE HCL 4 MG PO TABS: 4.0000 mg | ORAL_TABLET | Freq: Three times a day (TID) | ORAL | 0 refills | Status: AC | PRN

## 2021-02-19 MED ORDER — IBUPROFEN 600 MG PO TABS: 600.0000 mg | ORAL_TABLET | Freq: Four times a day (QID) | ORAL | 0 refills | Status: AC | PRN

## 2021-02-19 MED ORDER — LIDOCAINE 5 % EX PTCH: 1.0000 | MEDICATED_PATCH | CUTANEOUS | 0 refills | Status: AC

## 2021-02-19 NOTE — ED Provider Notes (Signed)
HPI  SUBJECTIVE:  Joyce Alexander is a 60 y.o. female who presents with right low back pain radiating down her lateral leg for the past 2 weeks.  Reports leg weakness secondary to pain. She describes it as sharp, intermittent, throbbing, lasting up to a minute.  No trauma, recent heavy lifting, N/V, fevers, flank pain, abdominal pain, urinary urgency, frequency, dysuria, cloudy or odorous urine, hematuria. No saddle anesthesia, distal weakness/numbness, bilateral radicular leg pain/weakness, fevers/night sweats,  bladder/ bowel incontinence, urinary retention.  She has tried Tylenol ES, methocarbamol, topical roll-on lidocaine and an unknown pain medication without improvement in her symptoms.  Symptoms worse with certain movements such as bending forward, turning over in bed, lying back.  No antipyretic in the past 6 hours.  She has a past medical history of DVT/PE, and is not on any anticoagulants.  She states that she was cleared to go off of it.  She has a history of hypertension, DJD, GERD.  No history of cancer, diabetes, aortic abdominal aneurysm, IVDU.  PMD: None.   Past Medical History:  Diagnosis Date   DJD (degenerative joint disease)    DVT (deep venous thrombosis) ([facility])    History of febrile seizure    Hypertension    left    PE (pulmonary thromboembolism) ([place])    Prediabetes 08/11/2019    Past Surgical History:  Procedure Laterality Date   KNEE SURGERY     Right    Family History  Problem Relation Age of Onset   Heart disease Mother    Diabetes Mother    Hypertension Mother    Diabetes Father    Hypertension Father     Social History   Tobacco Use   Smoking status: Never   Smokeless tobacco: Never  Vaping Use   Vaping Use: Never used  Substance Use Topics   Alcohol use: Yes    Comment: occ   Drug use: No    No current facility-administered medications for this encounter.  Current Outpatient Medications:    ibuprofen (ADVIL) 600 MG tablet, Take 1 tablet  (600 mg total) by mouth every 6 (six) hours as needed., Disp: 30 tablet, Rfl: 0   lidocaine (LIDODERM) 5 %, Place 1 patch onto the skin daily. Remove & Discard patch within 12 hours or as directed by MD, Disp: 15 patch, Rfl: 0   methylPREDNISolone (MEDROL DOSEPAK) 4 MG TBPK tablet, Take by mouth daily. Follow package instructions, Disp: 21 tablet, Rfl: 0   tiZANidine (ZANAFLEX) 4 MG tablet, Take 1 tablet (4 mg total) by mouth every 8 (eight) hours as needed for muscle spasms., Disp: 30 tablet, Rfl: 0   acetaminophen (TYLENOL) 500 MG tablet, Take 500 mg by mouth every 6 (six) hours as needed., Disp: , Rfl:    aspirin EC 81 MG tablet, Take 81 mg by mouth daily. Swallow whole., Disp: , Rfl:    hydrochlorothiazide (MICROZIDE) 12.5 MG capsule, TAKE 1 CAPSULE (12.5 MG TOTAL) BY MOUTH IN THE MORNING., Disp: 30 capsule, Rfl: 0  No Known Allergies   ROS  As noted in HPI.   Physical Exam  BP (!) 151/62 (BP Location: Right Arm)   Pulse 66   Temp 99.3 F (37.4 C)   Resp 19   LMP 08/07/2019   SpO2 99%   Constitutional: Well developed, well nourished, no acute distress.  Pain with movement.  Patient is ambulatory. Eyes:  EOMI, conjunctiva normal bilaterally HENT: Normocephalic, atraumatic,mucus membranes moist Respiratory: Normal inspiratory effort Cardiovascular: Normal rate  GI: nondistended.  skin: skin intact Musculoskeletal: no CVAT. + bilateral paralumbar tenderness, + right-sided paralumbar muscle spasm. No L-spine, SI joint bony tenderness. Bilateral lower extremities nontender, no tenderness over the hips, baseline ROM with intact PT pulses, Pain aggravated with hip flexion against resistance bilaterally, worse on the right.  Pain aggravated with right hip extension against resistance.  pain aggravated with int/ext rotation bilaterally. SLR positive right side. Sensation baseline light touch bilaterally for Pt, DTR's symmetric and intact bilaterally KJ , Motor symmetric bilateral 5/5 hip  flexion, quadriceps, hamstrings, EHL, foot dorsiflexion, foot plantarflexion, gait somewhat antalgic but without apparent new ataxia. Neurologic: Alert & oriented x 3, no focal neuro deficits Psychiatric: Speech and behavior appropriate   ED Course   Medications - No data to display  No orders of the defined types were placed in this encounter.   No results found for this or any previous visit (from the past 24 hour(s)). No results found.  ED Clinical Impression  1. Acute bilateral low back pain with right-sided sciatica     ED Assessment/Plan   No evidence of spinal cord involvement based on H&P.  Pain has been < 6 week duration. No historical red flags as noted in HPI. No physical red flags such as fever, bony tenderness, lower extremity weakness, saddle anesthesia. Imaging not indicated at this time.   Patient absolutely denies being on anticoagulants at this time.  Patient with paralumbar tenderness bilaterally, right-sided sciatica.  She has muscle spasms on the right.  Suspect degenerative disc disease at the L4/L5 level.  Home with Zanaflex, Medrol Dosepak, heat, soaks, gentle stretching, Tylenol/ibuprofen, lidocaine patch.  Follow-up with orthopedics if not better in a week.  Strict ER return precautions given.   Providing primary care list and will order assistance in finding a PMD.  Discussed labs, medical decision-making, and plan for follow-up with the patient.  Discussed signs and symptoms that should prompt return to the emergency department.  Patient agrees with plan.   Meds ordered this encounter  Medications   methylPREDNISolone (MEDROL DOSEPAK) 4 MG TBPK tablet    Sig: Take by mouth daily. Follow package instructions    Dispense:  21 tablet    Refill:  0   lidocaine (LIDODERM) 5 %    Sig: Place 1 patch onto the skin daily. Remove & Discard patch within 12 hours or as directed by MD    Dispense:  15 patch    Refill:  0   ibuprofen (ADVIL) 600 MG tablet     Sig: Take 1 tablet (600 mg total) by mouth every 6 (six) hours as needed.    Dispense:  30 tablet    Refill:  0   tiZANidine (ZANAFLEX) 4 MG tablet    Sig: Take 1 tablet (4 mg total) by mouth every 8 (eight) hours as needed for muscle spasms.    Dispense:  30 tablet    Refill:  0    *This clinic note was created using [employer]. Therefore, there may be occasional mistakes despite careful proofreading.  ?     Melynda Ripple, MD 02/19/21 1736

## 2021-02-19 NOTE — ED Triage Notes (Signed)
Pt presents with c/o side pain. States she think it is sciatic nerve pain and c/o not being able to move.

## 2021-02-19 NOTE — Discharge Instructions (Addendum)
600 mg of ibuprofen combined with 1000 mg of Tylenol together 3-4 times a day as needed for pain.  Heat, Epson salt soaks.  Many people find gentle stretching and deep tissue massage helpful. Try [facility] on [address]. They have very reasonable prices and take walk ins. Or you can go to  [facility]. Follow-up with orthopedics in a week if not better, go to the ER for the signs and symptoms we discussed.  Below is a list of primary care practices who are taking new patients for you to follow-up with.  [facility] -multiple locations.  See website at [email or web address]  [facility] internal medicine clinic Ground Floor - [facility], [place], [place], [place] [address] [phone]  [facility] at [facility] [address] [place], [place] [address] [phone]  [place], [place] [address] [phone]  [facility] [phone] [place] [address]  [facility]: [place]  [phone]  [facility]: [address] [place]  [phone]  [place] primary [facility] : [address] [place] [phone]  [facility]: [address] [place] [address] [phone]  [facility]: [place] [phone]  Dr. Blanchie Serve [place] [address]  [phone]  Go to www.goodrx.com  or www.costplusdrugs.com to look up your medications. This will give you a list of where you can find your prescriptions at the most affordable prices. Or ask the pharmacist what the cash price is, or  if they have any other discount programs available to help make your medication more affordable. This can be less expensive than what you would pay with insurance.

## 2021-04-16 ENCOUNTER — Other Ambulatory Visit: Payer: Self-pay

## 2021-04-16 ENCOUNTER — Ambulatory Visit (INDEPENDENT_AMBULATORY_CARE_PROVIDER_SITE_OTHER): Payer: 59 | Admitting: Medical

## 2021-04-16 DIAGNOSIS — J069 Acute upper respiratory infection, unspecified: Secondary | ICD-10-CM | POA: Diagnosis not present

## 2021-04-16 NOTE — Progress Notes (Signed)
Subjective:     Patient ID: Joyce Alexander, female   DOB: Jul 20, 1960, 61 y.o.   MRN: [id number]  This visit type was conducted due to national recommendations for restrictions regarding the COVID-19 Pandemic (e.g. social distancing) in an effort to limit this patient's exposure and mitigate transmission in our community.  Due to their co-morbid illnesses, this patient is at least at moderate risk for complications without adequate follow up.  This format is felt to be most appropriate for this patient at this time.    Documentation for virtual audio and video telecommunications through [place] encounter:  The patient was located at home. The provider was located in the office. The patient did consent to this visit and is aware of possible charges through their insurance for this visit.  The other persons participating in this telemedicine service were none. Time spent on call was 20 minutes and in review of previous records 20 minutes total.  This virtual service is not related to other E/M service within previous 7 days.   HPI Chief Complaint  Patient presents with   scratchy throat    Scratchy throat- Sunday night. Congestion, coughing, weak and not feeling good   Virtual consult for illness.  She notes 3-4 day history of symptoms including scratchy throat, congested, sinus pressure, stopped up, cold symptoms, occasional body aches, some chills.  No fever.  Has mild headache.  No ear pain.   Some cough, some loose stools.  No nausea, no vomiting.  No wheezing, no SOB.  Using nyquil and tylenol.  No other aggravating or relieving factors. No other complaint.   Past Medical History:  Diagnosis Date   DJD (degenerative joint disease)    DVT (deep venous thrombosis) (HCC)    History of febrile seizure    Hypertension    left    PE (pulmonary thromboembolism) ([facility])    Prediabetes 08/11/2019   Current Outpatient Medications on File Prior to Visit  Medication Sig Dispense Refill    acetaminophen (TYLENOL) 500 MG tablet Take 500 mg by mouth every 6 (six) hours as needed.     aspirin EC 81 MG tablet Take 81 mg by mouth daily. Swallow whole.     ibuprofen (ADVIL) 600 MG tablet Take 1 tablet (600 mg total) by mouth every 6 (six) hours as needed. 30 tablet 0   lidocaine (LIDODERM) 5 % Place 1 patch onto the skin daily. Remove & Discard patch within 12 hours or as directed by MD 15 patch 0   hydrochlorothiazide (MICROZIDE) 12.5 MG capsule TAKE 1 CAPSULE (12.5 MG TOTAL) BY MOUTH IN THE MORNING. 30 capsule 0   tiZANidine (ZANAFLEX) 4 MG tablet Take 1 tablet (4 mg total) by mouth every 8 (eight) hours as needed for muscle spasms. (Patient not taking: Reported on 04/16/2021) 30 tablet 0   No current facility-administered medications on file prior to visit.     Review of Systems As in subjective    Objective:   Physical Exam Due to coronavirus pandemic stay at home measures, patient visit was virtual and they were not examined in person.   Gen: wd, wn ,nad No labored breathing or witnessed wheezing     Assessment:     Encounter Diagnosis  Name Primary?   Upper respiratory tract infection, unspecified type Yes       Plan:     Discussed symptoms, concerns, advised supportive care, rest, hydration.  If not improving in the next 72 hours or if worse, then recheck.  There are no diagnoses linked to this encounter.  F/u prn

## 2022-04-01 ENCOUNTER — Encounter (HOSPITAL_COMMUNITY): Payer: Self-pay

## 2022-04-01 ENCOUNTER — Ambulatory Visit (HOSPITAL_COMMUNITY)
Admission: EM | Admit: 2022-04-01 | Discharge: 2022-04-01 | Disposition: A | Discharge status: Home / Self Care | Payer: 59 | Attending: Internal Medicine | Admitting: Internal Medicine

## 2022-04-01 DIAGNOSIS — R058 Other specified cough: Secondary | ICD-10-CM | POA: Diagnosis not present

## 2022-04-01 DIAGNOSIS — J069 Acute upper respiratory infection, unspecified: Secondary | ICD-10-CM | POA: Insufficient documentation

## 2022-04-01 DIAGNOSIS — Z1152 Encounter for screening for COVID-19: Secondary | ICD-10-CM | POA: Insufficient documentation

## 2022-04-01 NOTE — ED Triage Notes (Signed)
Pt is here for cough, headache, chills, vomiting, body aches, nasal congestion mostly at night  , no appetite x5days

## 2022-04-01 NOTE — ED Provider Notes (Signed)
[place]   [id number] 04/01/22 Arrival Time: 0830  ASSESSMENT & PLAN:  1. Viral upper respiratory tract infection    -History and exam is consistent with a viral URI.  I have high suspicion for COVID.  She was COVID tested today.  We will call if results are positive.  Work note was given.  Discussed that she can continue over-the-counter medications such as DayQuil/NyQuil, Mucinex, Tylenol, ibuprofen.  Encouraged vigorous p.o. hydration.  Encouraged good hand hygiene.  All questions answered and she agrees to plan.  No orders of the defined types were placed in this encounter.    Discharge Instructions      Your symptoms today are most likely being caused by a virus and should steadily improve in time it can take up to 7 to 10 days before you truly start to see a turnaround however things will get better    You can take Tylenol and/or Ibuprofen as needed for fever reduction and pain relief.   For cough: honey 1/2 to 1 teaspoon (you can dilute the honey in water or another fluid).  You can also use guaifenesin and dextromethorphan for cough. You can use a humidifier for chest congestion and cough.  If you don't have a humidifier, you can sit in the bathroom with the hot shower running.      For sore throat: try warm salt water gargles, cepacol lozenges, throat spray, warm tea or water with lemon/honey, popsicles or ice, or OTC cold relief medicine for throat discomfort.   For congestion: take a daily anti-histamine like Zyrtec, Claritin, and a oral decongestant, such as pseudoephedrine.  You can also use Flonase 1-2 sprays in each nostril daily.   It is important to stay hydrated: drink plenty of fluids (water, gatorade/powerade/pedialyte, juices, or teas) to keep your throat moisturized and help further relieve irritation/discomfort.         Reviewed expectations re: course of current medical issues. Questions answered. Outlined signs and symptoms indicating need  for more acute intervention. Patient verbalized understanding. After Visit Summary given.   SUBJECTIVE:  Pleasant 62 year old female comes urgent care to be evaluated for cough, headache, chills, vomiting, nasal congestion.  Symptoms have been present for 5 days.  She says cough is worse at night when she is lying down.  Cough is nonproductive.  She has tried taking over-the-counter DayQuil and NyQuil and Tylenol.  Not getting much symptomatic relief.  She reports intermittent subjective fevers and chills.  She also had 1 episode of minimal vomiting yesterday.  She has not had much of an appetite over the last several days.    She does have several known sick exposures.  Her boyfriend was sick last week with a cough.  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  She got 1 COVID shot but did not get a booster.  She did not get her flu shot this year.  Denies chest pain or shortness of breath.   Patient's last menstrual period was 08/07/2019. Past Surgical History:  Procedure Laterality Date   KNEE SURGERY     Right     OBJECTIVE:  Vitals:   04/01/22 0937  BP: 114/68  Pulse: 65  Resp: 16  Temp: 98.9 F (37.2 C)  TempSrc: Oral  SpO2: 97%     Physical Exam Vitals and nursing note reviewed.  Constitutional:      General: She is not in acute distress. HENT:     Head: Normocephalic.  Nose: Congestion present.     Mouth/Throat:     Pharynx: Posterior oropharyngeal erythema present. No oropharyngeal exudate.  Eyes:     Conjunctiva/sclera: Conjunctivae normal.  Cardiovascular:     Rate and Rhythm: Normal rate and regular rhythm.     Heart sounds: Normal heart sounds.  Pulmonary:     Effort: Pulmonary effort is normal.     Breath sounds: Normal breath sounds.  Abdominal:     Palpations: Abdomen is soft.     Tenderness: There is no abdominal tenderness.  Musculoskeletal:        General: Normal range of motion.     Cervical back: Normal range of motion and neck supple.   Lymphadenopathy:     Cervical: Cervical adenopathy present.  Neurological:     General: No focal deficit present.     Mental Status: She is alert.  Psychiatric:        Mood and Affect: Mood normal.      Labs: Results for orders placed or performed in visit on 05/29/21  Results Console HPV  Result Value Ref Range   CHL HPV Negative    Labs Reviewed  SARS CORONAVIRUS 2 (TAT 6-24 HRS)    Imaging: No results found.   No Known Allergies                                             Past Medical History:  Diagnosis Date   DJD (degenerative joint disease)    DVT (deep venous thrombosis) (HCC)    History of febrile seizure    Hypertension    left    PE (pulmonary thromboembolism) ([facility])    Prediabetes 08/11/2019    Social History   Socioeconomic History   Marital status: Married    Spouse name: Not on file   Number of children: 0   Years of education: Not on file   Highest education level: Not on file  Occupational History   Not on file  Tobacco Use   Smoking status: Never   Smokeless tobacco: Never  Vaping Use   Vaping Use: Never used  Substance and Sexual Activity   Alcohol use: Yes    Comment: occ   Drug use: No   Sexual activity: Not on file  Other Topics Concern   Not on file  Social History Narrative   Not on file   Social Determinants of Health   Financial Resource Strain: Not on file  Food Insecurity: Not on file  Transportation Needs: Not on file  Physical Activity: Not on file  Stress: Not on file  Social Connections: Not on file  Intimate Partner Violence: Not on file    Family History  Problem Relation Age of Onset   Heart disease Mother    Diabetes Mother    Hypertension Mother    Diabetes Father    Hypertension Father       Maricela Kawahara, Dorian Pod, MD 04/01/22 1009

## 2022-04-01 NOTE — Discharge Instructions (Signed)

## 2022-04-02 LAB — SARS CORONAVIRUS 2 (TAT 6-24 HRS): SARS Coronavirus 2: NEGATIVE

## 2023-05-14 ENCOUNTER — Other Ambulatory Visit: Payer: Self-pay | Admitting: Family Medicine

## 2023-05-14 DIAGNOSIS — Z86718 Personal history of other venous thrombosis and embolism: Secondary | ICD-10-CM

## 2023-05-14 DIAGNOSIS — M7989 Other specified soft tissue disorders: Secondary | ICD-10-CM

## 2023-05-18 ENCOUNTER — Ambulatory Visit
Admission: RE | Admit: 2023-05-18 | Discharge: 2023-05-18 | Disposition: A | Discharge status: Home / Self Care | Payer: Self-pay | Source: Ambulatory Visit | Attending: Family Medicine | Admitting: Family Medicine

## 2023-05-18 DIAGNOSIS — Z86718 Personal history of other venous thrombosis and embolism: Secondary | ICD-10-CM

## 2023-05-18 DIAGNOSIS — M7989 Other specified soft tissue disorders: Secondary | ICD-10-CM

## 2023-06-15 ENCOUNTER — Ambulatory Visit: Payer: 59 | Admitting: Obstetrics and Gynecology

## 2023-06-15 ENCOUNTER — Other Ambulatory Visit (HOSPITAL_COMMUNITY)
Admission: RE | Admit: 2023-06-15 | Discharge: 2023-06-15 | Disposition: A | Discharge status: Home / Self Care | Source: Ambulatory Visit | Attending: Obstetrics and Gynecology | Admitting: Obstetrics and Gynecology

## 2023-06-15 ENCOUNTER — Encounter: Payer: Self-pay | Admitting: Obstetrics and Gynecology

## 2023-06-15 VITALS — BP 120/72 | HR 49 | Ht 63.0 in | Wt 206.0 lb

## 2023-06-15 DIAGNOSIS — R87610 Atypical squamous cells of undetermined significance on cytologic smear of cervix (ASC-US): Secondary | ICD-10-CM

## 2023-06-15 DIAGNOSIS — R87810 Cervical high risk human papillomavirus (HPV) DNA test positive: Secondary | ICD-10-CM | POA: Insufficient documentation

## 2023-06-15 DIAGNOSIS — Z30432 Encounter for removal of intrauterine contraceptive device: Secondary | ICD-10-CM | POA: Diagnosis not present

## 2023-06-15 NOTE — Progress Notes (Signed)
 Pt is new to office here for abn pap smear and IUD removal. Pt declines mammo order today.

## 2023-06-15 NOTE — Progress Notes (Signed)
    GYNECOLOGY CLINIC COLPOSCOPY PROCEDURE NOTE  63 y.o. [id number] here for colposcopy for pap finding of:  ASCUS with positive high risk HPV.  Discussed role for HPV in cervical dysplasia, need for surveillance, nature of the procedure, and risks and benefits.  Pregnancy test: Pt is postmenopausal  No Known Allergies  Patient given informed consent, signed copy in the chart, time out was performed.    Placed in lithotomy position. Cervix viewed with speculum and colposcope after application of acetic acid and lugol's iodine.   Colposcopy Adequacy Cervix fully visualized: Yes  SCJ fully visualized: No    Colposcopy Findings no visible lesions, no mosaicism, no punctation, and no abnormal vasculature, no acetowhite changes or nonstaining with lugols  Corresponding biopsies were not obtained.    ECC specimen was obtained.  All specimens were labeled and sent to pathology.  Hemostatic measures: None  Complications: none  Patient tolerated the procedure well.  OBGyn Exam  Colposcopy Impressions Normal  Plan Due to recent high risk HPV, advise repap in 1 year.  Patient was given post procedure instructions.  Will follow up pathology and manage accordingly; patient will be contacted with results and recommendations.  Routine preventative health maintenance measures emphasized.   Warden Fillers, MD

## 2023-06-15 NOTE — Progress Notes (Signed)
    GYNECOLOGY OFFICE PROCEDURE NOTE  Joyce Alexander is a 63 y.o. G2P0020 here for  IUD removal. No GYN concerns.  Last pap smear was on 05/13/23 and was abnormal with ASCUS.  IUD Removal  Patient identified, informed consent performed, consent signed.  Patient was in the dorsal lithotomy position, normal external genitalia was noted.  A speculum was placed in the patient's vagina, normal discharge was noted, no lesions. The cervix was visualized, no lesions, no abnormal discharge.  The strings of the IUD were grasped and pulled using ring forceps. The IUD was removed in its entirety.  Patient tolerated the procedure well.    Patient is postmenopausal.  Routine preventative health maintenance measures emphasized.  See separate note for colposcopy.   Mariel Aloe, MD, FACOG Obstetrician & Gynecologist, [facility], [facility]

## 2023-06-17 LAB — SURGICAL PATHOLOGY

## 2024-01-03 ENCOUNTER — Ambulatory Visit: Attending: Internal Medicine | Admitting: Internal Medicine

## 2024-01-03 ENCOUNTER — Encounter: Payer: Self-pay | Admitting: Internal Medicine

## 2024-01-03 VITALS — BP 110/56 | HR 60 | Temp 97.4°F | Resp 16 | Ht 63.0 in | Wt 207.6 lb

## 2024-01-03 DIAGNOSIS — M170 Bilateral primary osteoarthritis of knee: Secondary | ICD-10-CM | POA: Insufficient documentation

## 2024-01-03 DIAGNOSIS — R7689 Other specified abnormal immunological findings in serum: Secondary | ICD-10-CM | POA: Insufficient documentation

## 2024-01-03 DIAGNOSIS — E559 Vitamin D deficiency, unspecified: Secondary | ICD-10-CM | POA: Insufficient documentation

## 2024-01-03 NOTE — Progress Notes (Signed)
 Office Visit Note  Patient: Joyce Alexander             Date of Birth: 08/26/1960           MRN: [id number]             PCP: Earvin Johnston PARAS, FNP Referring: Earvin Johnston PARAS, FNP Visit Date: 01/03/2024 Occupation: Data Unavailable  Subjective:  New Patient (Initial Visit) and Joint Pain   Discussed the use of AI scribe software for clinical note transcription with the patient, who gave verbal consent to proceed.  History of Present Illness   Joyce Alexander is a 63 year old female who presents with joint pain and abnormal lab test results with borderline positive RF and severely deficiency vitamin D.  She has been experiencing joint pain primarily in her knees and hands for about a year. The pain initially started in her knees and later involved her hands, particularly affecting her right hand, which she uses frequently. She describes difficulty making a fist, fingers getting stuck, and swelling in her knuckles, which has led her to stop wearing rings.  She has been taking meloxicam  intermittently, approximately half the time, primarily when experiencing significant stiffness or pain. She has also received steroid injections in her knees, with the last injection occurring about four months ago. Her knees have become stiff again. No oral steroids like prednisone or Medrol  have been taken.  She experiences joint stiffness in the mornings, which takes about 10 to 15 minutes to improve after waking up. Her family history includes both parents having some form of arthritis, though not specifically rheumatoid arthritis.  She has undergone arthroscopy on her knee in the past. She has not yet scheduled a bone density screening due to location preferences, as she prefers a site in [place] over [place].  She has worked in [employer].     Labs reviewed Vit D 9.5 RF 14 ANA neg CCP neg ESR/CRP wnl  Activities of Daily Living:  Patient reports morning stiffness for 15-20  minutes.   Patient Reports nocturnal pain.  Difficulty dressing/grooming: Denies Difficulty climbing stairs: Reports Difficulty getting out of chair: Denies Difficulty using hands for taps, buttons, cutlery, and/or writing: Reports  Review of Systems  Constitutional:  Positive for fatigue.  HENT:  Negative for mouth sores and mouth dryness.   Eyes:  Positive for dryness.  Respiratory:  Negative for shortness of breath.   Cardiovascular:  Positive for chest pain and swelling in legs/feet. Negative for palpitations.  Gastrointestinal:  Positive for constipation. Negative for blood in stool and diarrhea.  Endocrine: Positive for increased urination.  Genitourinary:  Negative for involuntary urination.  Musculoskeletal:  Positive for joint pain, joint pain, joint swelling, myalgias, muscle weakness, morning stiffness, muscle tenderness and myalgias. Negative for gait problem.  Skin:  Positive for hair loss. Negative for color change, rash and sensitivity to sunlight.  Allergic/Immunologic: Positive for susceptible to infections.  Neurological:  Negative for dizziness and headaches.  Hematological:  Negative for swollen glands.  Psychiatric/Behavioral:  Positive for sleep disturbance. Negative for depressed mood. The patient is nervous/anxious.     PMFS History:  Patient Active Problem List   Diagnosis Date Noted   Rheumatoid factor positive 01/03/2024   Bilateral primary osteoarthritis of knee 01/03/2024   Vitamin D deficiency 01/03/2024   Prediabetes 08/11/2019   Pulmonary emboli (HCC) 06/24/2013   History of pulmonary embolism 06/23/2013   Obesity 06/23/2013   Anemia 06/23/2013   Pleuritic chest  pain 06/23/2013   Hyperglycemia 06/23/2013   GERD (gastroesophageal reflux disease) 06/23/2013    Past Medical History:  Diagnosis Date   DJD (degenerative joint disease)    DVT (deep venous thrombosis) (HCC)    History of febrile seizure    Hypertension    left    PE (pulmonary  thromboembolism) (HCC)    Prediabetes 08/11/2019   Vaginal Pap smear, abnormal    Vitamin D deficiency     Family History  Problem Relation Age of Onset   Heart disease Mother    Diabetes Mother    Hypertension Mother    Diabetes Father    Hypertension Father    Past Surgical History:  Procedure Laterality Date   KNEE SURGERY     Right   SHOULDER SURGERY     Social History   Tobacco Use   Smoking status: Never    Passive exposure: Past   Smokeless tobacco: Never  Vaping Use   Vaping status: Never Used  Substance Use Topics   Alcohol use: Not Currently    Comment: occ   Drug use: No   Social History   Social History Narrative   Not on file     Immunization History  Administered Date(s) Administered   Tdap 11/06/2009     Objective: Vital Signs: BP (!) 110/56 (BP Location: Right Arm, Patient Position: Sitting, Cuff Size: Normal)   Pulse 60   Temp (!) 97.4 F (36.3 C)   Resp 16   Ht 5' 3 (1.6 m)   Wt 207 lb 9.6 oz (94.2 kg)   LMP 08/07/2019   BMI 36.77 kg/m    Physical Exam Eyes:     Conjunctiva/sclera: Conjunctivae normal.  Cardiovascular:     Rate and Rhythm: Normal rate and regular rhythm.  Pulmonary:     Effort: Pulmonary effort is normal.     Breath sounds: Normal breath sounds.  Lymphadenopathy:     Cervical: No cervical adenopathy.  Skin:    General: Skin is warm and dry.     Findings: No rash.  Neurological:     Mental Status: She is alert.  Psychiatric:        Mood and Affect: Mood normal.      Musculoskeletal Exam:  Neck full ROM no tenderness Shoulders full ROM no tenderness or swelling Elbows full ROM no tenderness or swelling Wrists full ROM no tenderness or swelling Fingers full ROM no tenderness or swelling, right hand stiffness without any focal tenderness or swelling Knees full ROM no tenderness or swelling, bilateral patellofemoral crepitus Ankles full ROM no tenderness or swelling     Investigation: No additional  findings.  Imaging: No results found.  Recent Labs: Lab Results  Component Value Date   WBC 4.5 08/10/2019   HGB 13.6 08/10/2019   PLT 201 08/10/2019   NA 137 08/10/2019   K 4.3 08/10/2019   CL 105 08/10/2019   CO2 21 08/10/2019   GLUCOSE 88 08/10/2019   BUN 15 08/10/2019   CREATININE 0.73 08/10/2019   BILITOT 0.6 08/10/2019   ALKPHOS 47 (L) 08/10/2019   AST 19 08/10/2019   ALT 13 08/10/2019   PROT 6.7 08/10/2019   ALBUMIN 4.0 08/10/2019   CALCIUM 9.2 08/10/2019   GFRAA 106 08/10/2019    Speciality Comments: No specialty comments available.  Procedures:  No procedures performed Allergies: Codeine   Assessment / Plan:     Visit Diagnoses: Rheumatoid factor positive - Plan: Sedimentation rate, Rheumatoid factor, C-reactive protein  Suspected rheumatoid arthritis due to borderline positive rheumatoid factor. Symptoms include joint stiffness and swelling, particularly in hands.  There is no active synovitis on physical exam today so difficult to confirm possible inflammatory disease we will recheck the lab work.  Would also repeat the borderline rheumatoid factor if associated with real new onset of RA would expect consistent or upward trending positive.  If significantly elevated recommend initial trial of DMARD treatment, otherwise could monitor try to see back during if there is an exacerbation or possibly follow-up exam including musculoskeletal ultrasound exam for subtle synovitis. - Order blood tests for rheumatoid factor and inflammatory markers. - Provided information on hydroxychloroquine if rheumatoid arthritis is confirmed. - Follow up if tests indicate rheumatoid arthritis for potential hydroxychloroquine initiation.   Bilateral primary osteoarthritis of knee Chronic osteoarthritis with joint stiffness, pain, and crepitus, especially in the right hand and knees. Cartilage degeneration leads to bone-on-bone contact, notably in knees. Prefers minimal medication use. -  Continue meloxicam  as needed. - Consider joint replacement for advanced knee osteoarthritis if symptoms worsen.  Vitamin D deficiency On high-dose replacement 50,000 units once weekly through PCP office.   Orders: Orders Placed This Encounter  Procedures   Sedimentation rate   Rheumatoid factor   C-reactive protein   No orders of the defined types were placed in this encounter.    Follow-Up Instructions: Return if symptoms worsen or fail to improve.   Lonni LELON Ester, MD  Note - This record has been created using [employer].  Chart creation errors have been sought, but may not always  have been located. Such creation errors do not reflect on  the standard of medical care.

## 2024-01-04 LAB — RHEUMATOID FACTOR: Rheumatoid fact SerPl-aCnc: 11 [IU]/mL (ref <14)

## 2024-01-04 LAB — SEDIMENTATION RATE: Sed Rate: 6 mm/h (ref 0–30)

## 2024-01-04 LAB — C-REACTIVE PROTEIN: CRP: <3 mg/L (ref <8.0)

## 2024-02-18 ENCOUNTER — Encounter (HOSPITAL_COMMUNITY): Payer: Self-pay

## 2024-02-18 ENCOUNTER — Ambulatory Visit (HOSPITAL_COMMUNITY)
Admission: EM | Admit: 2024-02-18 | Discharge: 2024-02-18 | Disposition: A | Discharge status: Home / Self Care | Source: Ambulatory Visit | Attending: Nurse Practitioner | Admitting: Nurse Practitioner

## 2024-02-18 DIAGNOSIS — M79672 Pain in left foot: Secondary | ICD-10-CM | POA: Diagnosis not present

## 2024-02-18 MED ORDER — PREDNISONE 20 MG PO TABS: 40.0000 mg | ORAL_TABLET | Freq: Every day | ORAL | 0 refills | Status: AC

## 2024-02-18 MED ORDER — METHYLPREDNISOLONE ACETATE 40 MG/ML IJ SUSP: 40.0000 mg | Freq: Once | INTRAMUSCULAR | Status: DC

## 2024-02-18 MED ORDER — METHYLPREDNISOLONE SODIUM SUCC 125 MG IJ SOLR
60.0000 mg | Freq: Once | INTRAMUSCULAR | Status: AC
  Administered 2024-02-18: 60 mg via INTRAMUSCULAR

## 2024-02-18 MED ORDER — METHYLPREDNISOLONE SODIUM SUCC 125 MG IJ SOLR
INTRAMUSCULAR | Status: AC
  Filled 2024-02-18: qty 2

## 2024-02-18 NOTE — ED Provider Notes (Signed)
 [facility]    CSN: [id number] Arrival date & time: 02/18/24  1034      History   Chief Complaint Chief Complaint  Patient presents with   Foot Pain    HPI Joyce Alexander is a 63 y.o. female.   Patient presents today with 2 day history of left foot pain.  She denies recent fall, trauma, or known injury to the foot.  Reports history of plantar fascitis of the left heel.  No previous injury or trauma to the foot.  No fever, nausea/vomiting. Reports it hurts with touching the area or when she walks on it.  Has been having to use a cane to ambulate and typically does not have to use a cane.  Has taken Tylenol  and Meloxicam  without much benefit.    Past Medical History:  Diagnosis Date   DJD (degenerative joint disease)    DVT (deep venous thrombosis) (HCC)    History of febrile seizure    Hypertension    left    PE (pulmonary thromboembolism) (HCC)    Prediabetes 08/11/2019   Vaginal Pap smear, abnormal    Vitamin D deficiency     Patient Active Problem List   Diagnosis Date Noted   Rheumatoid factor positive 01/03/2024   Bilateral primary osteoarthritis of knee 01/03/2024   Vitamin D deficiency 01/03/2024   Prediabetes 08/11/2019   Pulmonary emboli (HCC) 06/24/2013   History of pulmonary embolism 06/23/2013   Obesity 06/23/2013   Anemia 06/23/2013   Pleuritic chest pain 06/23/2013   Hyperglycemia 06/23/2013   GERD (gastroesophageal reflux disease) 06/23/2013    Past Surgical History:  Procedure Laterality Date   KNEE SURGERY     Right   SHOULDER SURGERY      OB History     Gravida  2   Para      Term      Preterm      AB  2   Living  0      SAB      IAB  2   Ectopic      Multiple      Live Births               Home Medications    Prior to Admission medications   Medication Sig Start Date End Date Taking? Authorizing Provider  predniSONE  (DELTASONE ) 20 MG tablet Take 2 tablets (40 mg total) by mouth daily with  breakfast for 5 days. 02/18/24 02/23/24 Yes Chandra Harlene LABOR, NP  acetaminophen  (TYLENOL ) 500 MG tablet Take 500 mg by mouth every 6 (six) hours as needed.    [provider]  aspirin  EC 81 MG tablet Take 81 mg by mouth daily. Swallow whole.    [provider]  b complex vitamins capsule Take 1 capsule by mouth daily.    [provider]  cholecalciferol (VITAMIN D3) 25 MCG (1000 UNIT) tablet Take 1,000 Units by mouth daily.    [provider]  hydrochlorothiazide  (MICROZIDE ) 12.5 MG capsule TAKE 1 CAPSULE (12.5 MG TOTAL) BY MOUTH IN THE MORNING. Patient not taking: Reported on 01/03/2024 12/25/19 01/24/20  Ladona Heinz, MD  ibuprofen  (ADVIL ) 600 MG tablet Take 1 tablet (600 mg total) by mouth every 6 (six) hours as needed. Patient not taking: Reported on 02/18/2024 02/19/21   Van Knee, MD  lidocaine  (LIDODERM ) 5 % Place 1 patch onto the skin daily. Remove & Discard patch within 12 hours or as directed by MD Patient not taking: Reported  on 01/03/2024 02/19/21   Van Knee, MD  meloxicam  (MOBIC ) 15 MG tablet Take 15 mg by mouth as needed. 10/25/23 10/19/24  [provider]  meloxicam  (MOBIC ) 7.5 MG tablet Take 7.5 mg by mouth daily. Patient not taking: Reported on 01/03/2024    [provider]  methocarbamol (ROBAXIN) 500 MG tablet Take 500 mg by mouth.    [provider]  tiZANidine  (ZANAFLEX ) 4 MG tablet Take 1 tablet (4 mg total) by mouth every 8 (eight) hours as needed for muscle spasms. Patient not taking: Reported on 01/03/2024 02/19/21   Mortenson, Ashley, MD  Vitamin D, Ergocalciferol, (DRISDOL) 1.25 MG (50000 UNIT) CAPS capsule Take 50,000 Units by mouth once a week. 12/10/23   [provider]    Family History Family History  Problem Relation Age of Onset   Heart disease Mother    Diabetes Mother    Hypertension Mother    Diabetes Father    Hypertension Father     Social History Social History    Tobacco Use   Smoking status: Never    Passive exposure: Past   Smokeless tobacco: Never  Vaping Use   Vaping status: Never Used  Substance Use Topics   Alcohol use: Not Currently    Comment: occ   Drug use: No     Allergies   Codeine   Review of Systems Review of Systems Per HPI  Physical Exam Triage Vital Signs ED Triage Vitals [02/18/24 1118]  Encounter Vitals Group     BP 136/77     Girls Systolic BP Percentile      Girls Diastolic BP Percentile      Boys Systolic BP Percentile      Boys Diastolic BP Percentile      Pulse Rate (!) 57     Resp 16     Temp 98.2 F (36.8 C)     Temp Source Oral     SpO2 94 %     Weight      Height      Head Circumference      Peak Flow      Pain Score 10     Pain Loc      Pain Education      Exclude from Growth Chart    No data found.  Updated Vital Signs BP 136/77 (BP Location: Right Arm)   Pulse (!) 57   Temp 98.2 F (36.8 C) (Oral)   Resp 16   LMP 08/07/2019   SpO2 94%   Visual Acuity Right Eye Distance:   Left Eye Distance:   Bilateral Distance:    Right Eye Near:   Left Eye Near:    Bilateral Near:     Physical Exam Vitals and nursing note reviewed.  Constitutional:      General: She is not in acute distress.    Appearance: Normal appearance. She is not toxic-appearing.  HENT:     Mouth/Throat:     Mouth: Mucous membranes are moist.     Pharynx: Oropharynx is clear.  Pulmonary:     Effort: Pulmonary effort is normal. No respiratory distress.  Musculoskeletal:     Comments: Inspection: no swelling, bruising, obvious deformity or redness to left or right foot Palpation: tender to palpation left medial foot near base of first MCP; no obvious deformities palpated; no warmth ROM: Full ROM to bilateral feet and ankles; pain with ROM of the left foot Strength: 5/5 bilateral lower extremities Neurovascular: neurovascularly intact in distal  bilateral lower extremities   Skin:    General: Skin is warm  and dry.     Capillary Refill: Capillary refill takes less than 2 seconds.     Coloration: Skin is not jaundiced or pale.     Findings: No erythema.  Neurological:     Mental Status: She is alert and oriented to person, place, and time.  Psychiatric:        Behavior: Behavior is cooperative.      UC Treatments / Results  Labs (all labs ordered are listed, but only abnormal results are displayed) Labs Reviewed - No data to display  EKG   Radiology No results found.  Procedures Procedures (including critical care time)  Medications Ordered in UC Medications  methylPREDNISolone  sodium succinate (SOLU-MEDROL ) 125 mg/2 mL injection 60 mg (60 mg Intramuscular Given 02/18/24 1142)    Initial Impression / Assessment and Plan / UC Course  I have reviewed the triage vital signs and the nursing notes.  Pertinent labs & imaging results that were available during my care of the patient were reviewed by me and considered in my medical decision making (see chart for details).   Patient is a well-appearing 63 year old female presenting for left foot pain today.  Given no recent trauma or injury, xray imaging deferred at this time.  Vital signs are stable and examination is unremarkable with exception for pain on palpation and range of motion of the foot.  Query inflammatory arthritis.  Treat with Solu Medrol  in urgent care today for pain and start oral prednisone  tomorrow.  Recommended follow up with PCP if symptoms persist/worsen despite treatment.  The patient was given the opportunity to ask questions.  All questions answered to their satisfaction.  The patient is in agreement to this plan.   Final Clinical Impressions(s) / UC Diagnoses   Final diagnoses:  Left foot pain     Discharge Instructions      You were seen today for foot pain.  I wonder if you have inflammatory arthritis.  We have given you a steroid injection today for pain and inflammation.  Take the oral prednisone   starting tomorrow morning to help with inflammation.  Avoid NSAIDs while taking the prednisone .  You can continue Tylenol  as needed for pain.  Follow up with PCP if symptoms persist/worsen despite treatment.     ED Prescriptions     Medication Sig Dispense Auth. Provider   predniSONE  (DELTASONE ) 20 MG tablet Take 2 tablets (40 mg total) by mouth daily with breakfast for 5 days. 10 tablet Chandra Harlene LABOR, NP      PDMP not reviewed this encounter.   Chandra Harlene LABOR, NP 02/18/24 1246

## 2024-02-18 NOTE — ED Triage Notes (Signed)
 Patient reports that she has had pain from the top of her right foot that goes around to the sole of the foot. Patient denies any injury. Patient states hurts more touching and weight bearing. Patient walking with a cane and states was not using until she began having foot pain.  Patient states she has taken Tylenol  for her pain and one Meloxicam  from a previous prescription.

## 2024-02-18 NOTE — Discharge Instructions (Signed)
 You were seen today for foot pain.  I wonder if you have inflammatory arthritis.  We have given you a steroid injection today for pain and inflammation.  Take the oral prednisone  starting tomorrow morning to help with inflammation.  Avoid NSAIDs while taking the prednisone .  You can continue Tylenol  as needed for pain.  Follow up with PCP if symptoms persist/worsen despite treatment.
